# Patient Record
Sex: Female | Born: 1950 | Race: Black or African American | Hispanic: No | Marital: Single | Smoking: Never smoker
Health system: Southern US, Community
[De-identification: ages and names within clinical notes are randomized; demographics above are authoritative.]

## PROBLEM LIST (undated history)

## (undated) HISTORY — PX: HIP SURGERY: SHX245

## (undated) HISTORY — PX: ABDOMINAL HYSTERECTOMY: SHX81

---

## 2010-09-15 ENCOUNTER — Ambulatory Visit (HOSPITAL_COMMUNITY)
Admission: RE | Admit: 2010-09-15 | Discharge: 2010-09-15 | Disposition: A | Discharge status: Home / Self Care | Payer: Self-pay | Source: Ambulatory Visit | Attending: Family Medicine | Admitting: Family Medicine

## 2010-09-15 ENCOUNTER — Other Ambulatory Visit (HOSPITAL_COMMUNITY): Payer: Self-pay | Admitting: Family Medicine

## 2010-09-15 DIAGNOSIS — M25519 Pain in unspecified shoulder: Secondary | ICD-10-CM | POA: Insufficient documentation

## 2010-09-15 DIAGNOSIS — T1490XA Injury, unspecified, initial encounter: Secondary | ICD-10-CM

## 2010-09-15 DIAGNOSIS — M79609 Pain in unspecified limb: Secondary | ICD-10-CM | POA: Insufficient documentation

## 2010-10-22 ENCOUNTER — Other Ambulatory Visit (HOSPITAL_COMMUNITY): Payer: Self-pay | Admitting: Family Medicine

## 2010-10-22 ENCOUNTER — Ambulatory Visit (HOSPITAL_COMMUNITY)
Admission: RE | Admit: 2010-10-22 | Discharge: 2010-10-22 | Disposition: A | Discharge status: Home / Self Care | Payer: Self-pay | Source: Ambulatory Visit | Attending: Family Medicine | Admitting: Family Medicine

## 2010-10-22 DIAGNOSIS — R52 Pain, unspecified: Secondary | ICD-10-CM

## 2010-10-22 DIAGNOSIS — M25559 Pain in unspecified hip: Secondary | ICD-10-CM | POA: Insufficient documentation

## 2010-11-14 ENCOUNTER — Inpatient Hospital Stay (INDEPENDENT_AMBULATORY_CARE_PROVIDER_SITE_OTHER)
Admission: RE | Admit: 2010-11-14 | Discharge: 2010-11-14 | Disposition: A | Discharge status: Home / Self Care | Payer: Self-pay | Source: Ambulatory Visit | Attending: Emergency Medicine | Admitting: Emergency Medicine

## 2010-11-14 DIAGNOSIS — M76899 Other specified enthesopathies of unspecified lower limb, excluding foot: Secondary | ICD-10-CM

## 2010-12-26 ENCOUNTER — Inpatient Hospital Stay (INDEPENDENT_AMBULATORY_CARE_PROVIDER_SITE_OTHER)
Admission: RE | Admit: 2010-12-26 | Discharge: 2010-12-26 | Disposition: A | Discharge status: Home / Self Care | Payer: Self-pay | Source: Ambulatory Visit | Attending: Family Medicine | Admitting: Family Medicine

## 2010-12-26 ENCOUNTER — Ambulatory Visit (INDEPENDENT_AMBULATORY_CARE_PROVIDER_SITE_OTHER): Payer: Self-pay

## 2010-12-26 DIAGNOSIS — M1990 Unspecified osteoarthritis, unspecified site: Secondary | ICD-10-CM

## 2011-04-13 ENCOUNTER — Other Ambulatory Visit (HOSPITAL_COMMUNITY): Payer: Self-pay | Admitting: Family Medicine

## 2011-04-13 DIAGNOSIS — M25551 Pain in right hip: Secondary | ICD-10-CM

## 2011-04-13 DIAGNOSIS — M25552 Pain in left hip: Secondary | ICD-10-CM

## 2011-04-17 ENCOUNTER — Inpatient Hospital Stay (HOSPITAL_COMMUNITY): Admission: RE | Admit: 2011-04-17 | Payer: Self-pay | Source: Ambulatory Visit

## 2011-04-18 ENCOUNTER — Ambulatory Visit (HOSPITAL_COMMUNITY)
Admission: RE | Admit: 2011-04-18 | Discharge: 2011-04-18 | Disposition: A | Discharge status: Home / Self Care | Payer: Medicaid Other | Source: Ambulatory Visit | Attending: Family Medicine | Admitting: Family Medicine

## 2011-04-18 DIAGNOSIS — M1610 Unilateral primary osteoarthritis, unspecified hip: Secondary | ICD-10-CM | POA: Insufficient documentation

## 2011-04-18 DIAGNOSIS — M25551 Pain in right hip: Secondary | ICD-10-CM

## 2011-04-18 DIAGNOSIS — M169 Osteoarthritis of hip, unspecified: Secondary | ICD-10-CM | POA: Insufficient documentation

## 2011-04-18 DIAGNOSIS — M87059 Idiopathic aseptic necrosis of unspecified femur: Secondary | ICD-10-CM | POA: Insufficient documentation

## 2011-04-18 DIAGNOSIS — M6580 Other synovitis and tenosynovitis, unspecified site: Secondary | ICD-10-CM | POA: Insufficient documentation

## 2011-04-18 DIAGNOSIS — M25552 Pain in left hip: Secondary | ICD-10-CM

## 2011-12-26 ENCOUNTER — Other Ambulatory Visit (HOSPITAL_COMMUNITY): Payer: Self-pay | Admitting: Family Medicine

## 2011-12-26 ENCOUNTER — Ambulatory Visit (HOSPITAL_COMMUNITY)
Admission: RE | Admit: 2011-12-26 | Discharge: 2011-12-26 | Disposition: A | Discharge status: Home / Self Care | Payer: Medicaid Other | Source: Ambulatory Visit | Attending: Family Medicine | Admitting: Family Medicine

## 2011-12-26 DIAGNOSIS — R52 Pain, unspecified: Secondary | ICD-10-CM

## 2011-12-26 DIAGNOSIS — M79609 Pain in unspecified limb: Secondary | ICD-10-CM | POA: Insufficient documentation

## 2013-01-06 ENCOUNTER — Other Ambulatory Visit: Payer: Self-pay | Admitting: Internal Medicine

## 2013-01-06 DIAGNOSIS — N6320 Unspecified lump in the left breast, unspecified quadrant: Secondary | ICD-10-CM

## 2013-01-06 DIAGNOSIS — N644 Mastodynia: Secondary | ICD-10-CM

## 2013-01-19 ENCOUNTER — Ambulatory Visit
Admission: RE | Admit: 2013-01-19 | Discharge: 2013-01-19 | Disposition: A | Discharge status: Home / Self Care | Payer: Medicare Other | Source: Ambulatory Visit | Attending: Internal Medicine | Admitting: Internal Medicine

## 2013-01-19 ENCOUNTER — Ambulatory Visit
Admission: RE | Admit: 2013-01-19 | Discharge: 2013-01-19 | Disposition: A | Discharge status: Home / Self Care | Payer: Medicaid Other | Source: Ambulatory Visit | Attending: Internal Medicine | Admitting: Internal Medicine

## 2013-01-19 DIAGNOSIS — N6320 Unspecified lump in the left breast, unspecified quadrant: Secondary | ICD-10-CM

## 2013-01-19 DIAGNOSIS — N644 Mastodynia: Secondary | ICD-10-CM

## 2015-10-04 ENCOUNTER — Emergency Department (INDEPENDENT_AMBULATORY_CARE_PROVIDER_SITE_OTHER)
Admission: EM | Admit: 2015-10-04 | Discharge: 2015-10-04 | Disposition: A | Discharge status: Home / Self Care | Payer: Medicare Other | Source: Home / Self Care | Attending: Family Medicine | Admitting: Family Medicine

## 2015-10-04 ENCOUNTER — Encounter (HOSPITAL_COMMUNITY): Payer: Self-pay | Admitting: Emergency Medicine

## 2015-10-04 DIAGNOSIS — L259 Unspecified contact dermatitis, unspecified cause: Secondary | ICD-10-CM

## 2015-10-04 MED ORDER — MOMETASONE FUROATE 0.1 % EX CREA: 1.0000 "application " | TOPICAL_CREAM | Freq: Every day | CUTANEOUS | Status: DC

## 2015-10-04 NOTE — ED Notes (Signed)
[facility] with a complaint of a rash on both hands x 8 days. The patient stated that she has not changed any cleaners or wearing gloves.

## 2015-10-04 NOTE — ED Provider Notes (Signed)
CSN: [id number]     Arrival date & time 10/04/15  1743 History   First MD Initiated Contact with Patient 10/04/15 1954     Chief Complaint  Patient presents with  . Rash   (Consider location/radiation/quality/duration/timing/severity/associated sxs/prior Treatment) Patient is a 65 y.o. female presenting with rash. The history is provided by the patient. No language interpreter was used.  Rash Patient presents for pruritic rash on both hands, beginning last Tues, March 16th.  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 Took Benadryl without relief. Was off work for the weekend, noticed it got worse rather than better. No new hygiene products or household products, no new detergents.  This week was working in job and worsened again.   No fevers or chills, does not feel ill. No sneeze or cough, no rhinorrhea. Takes no oral medications.   Allergy to PCN, "rash".    History reviewed. No pertinent past medical history. Past Surgical History  Procedure Laterality Date  . Hip surgery     History reviewed. No pertinent family history. Social History  Substance Use Topics  . Smoking status: Never Smoker   . Smokeless tobacco: None  . Alcohol Use: No   OB History    No data available     Review of Systems  Skin: Positive for rash.    Allergies  Penicillins  Home Medications   Prior to Admission medications   Medication Sig Start Date End Date Taking? Authorizing Provider  mometasone (ELOCON) 0.1 % cream Apply 1 application topically daily. 10/04/15   Barbaraann BarthelJames O Malea Swilling, MD   Meds Ordered and Administered this Visit  Medications - No data to display  BP 150/94 mmHg  Pulse 86  Temp(Src) 97.9 F (36.6 C) (Oral)  Resp 16  SpO2 99% No data found.   Physical Exam  Constitutional: She appears well-developed and well-nourished. No distress.  HENT:  Mouth/Throat: Oropharynx is clear and moist. No oropharyngeal exudate.  Skin:  She is not diaphoretic.  Dorsum of both hands with pruritic raised rash in glove distribution from wrist to PIP joints; spares distal digits and palms bilaterally.  Palpable radial pulses bilat. Sensation grossly intact bilat.  Handgrip and strength grossly intact.   No active synovitis.     ED Course  Procedures (including critical care time)  Labs Review Labs Reviewed - No data to display  Imaging Review No results found.   Visual Acuity Review  Right Eye Distance:   Left Eye Distance:   Bilateral Distance:    Right Eye Near:   Left Eye Near:    Bilateral Near:         MDM   1. Contact dermatitis    Contact dermatitis; unclear inciting agent. Topical mometasone for up to 2 weeks.  To consider possible inciting agents in her environment. Sees Dr August Saucerean as her primary doctor. Encouraged to follow up with him if not better.   Paula ComptonJames Lidia Clavijo, MD    Barbaraann BarthelJames O Neill Jurewicz, MD 10/04/15 2011

## 2015-10-04 NOTE — Discharge Instructions (Signed)
It is a pleasure to see you today.  I believe the rash and itch on your hands is due to an allergic reaction to something that has been in contact with your skin.   I am prescribing MOMETASONE 0.1% cream, apply a small amount to the affected area once daily.  Do not use for more than 15 consecutive days, as the topical steroid can cause skin thinning and discoloration.   Follow up with Dr August Saucerean if not improving.

## 2015-12-08 ENCOUNTER — Encounter (HOSPITAL_COMMUNITY): Payer: Self-pay | Admitting: *Deleted

## 2015-12-08 ENCOUNTER — Ambulatory Visit (HOSPITAL_COMMUNITY)
Admission: EM | Admit: 2015-12-08 | Discharge: 2015-12-08 | Disposition: A | Discharge status: Home / Self Care | Payer: Medicare Other | Attending: Family Medicine | Admitting: Family Medicine

## 2015-12-08 ENCOUNTER — Ambulatory Visit (INDEPENDENT_AMBULATORY_CARE_PROVIDER_SITE_OTHER): Payer: Medicare Other

## 2015-12-08 DIAGNOSIS — W010XXA Fall on same level from slipping, tripping and stumbling without subsequent striking against object, initial encounter: Secondary | ICD-10-CM

## 2015-12-08 DIAGNOSIS — M5432 Sciatica, left side: Secondary | ICD-10-CM

## 2015-12-08 DIAGNOSIS — M47816 Spondylosis without myelopathy or radiculopathy, lumbar region: Secondary | ICD-10-CM | POA: Diagnosis not present

## 2015-12-08 DIAGNOSIS — M25552 Pain in left hip: Secondary | ICD-10-CM | POA: Diagnosis not present

## 2015-12-08 MED ORDER — PREDNISONE 20 MG PO TABS: ORAL_TABLET | ORAL | Status: DC

## 2015-12-08 MED ORDER — IBUPROFEN 800 MG PO TABS
ORAL_TABLET | ORAL | Status: AC
  Filled 2015-12-08: qty 1

## 2015-12-08 MED ORDER — HYDROCODONE-ACETAMINOPHEN 5-325 MG PO TABS: 1.0000 | ORAL_TABLET | Freq: Four times a day (QID) | ORAL | Status: DC | PRN

## 2015-12-08 MED ORDER — CYCLOBENZAPRINE HCL 10 MG PO TABS: 10.0000 mg | ORAL_TABLET | Freq: Three times a day (TID) | ORAL | Status: DC | PRN

## 2015-12-08 MED ORDER — IBUPROFEN 800 MG PO TABS
800.0000 mg | ORAL_TABLET | Freq: Once | ORAL | Status: AC
  Administered 2015-12-08: 800 mg via ORAL

## 2015-12-08 MED ORDER — TRAMADOL HCL 50 MG PO TABS: 50.0000 mg | ORAL_TABLET | Freq: Four times a day (QID) | ORAL | Status: DC | PRN

## 2015-12-08 NOTE — Discharge Instructions (Signed)
°  Sciatica °Sciatica is pain, weakness, numbness, or tingling along your sciatic nerve. The nerve starts in the lower back and runs down the back of each leg. Nerve damage or certain conditions pinch or put pressure on the sciatic nerve. This causes the pain, weakness, and other discomforts of sciatica. °HOME CARE  °· Only take medicine as told by your doctor. °· Apply ice to the affected area for 20 minutes. Do this 3-4 times a day for the first 48-72 hours. Then try heat in the same way. °· Exercise, stretch, or do your usual activities if these do not make your pain worse. °· Go to physical therapy as told by your doctor. °· Keep all doctor visits as told. °· Do not wear high heels or shoes that are not supportive. °· Get a firm mattress if your mattress is too soft to lessen pain and discomfort. °GET HELP RIGHT AWAY IF:  °· You cannot control when you poop (bowel movement) or pee (urinate). °· You have more weakness in your lower back, lower belly (pelvis), butt (buttocks), or legs. °· You have redness or puffiness (swelling) of your back. °· You have a burning feeling when you pee. °· You have pain that gets worse when you lie down. °· You have pain that wakes you from your sleep. °· Your pain is worse than past pain. °· Your pain lasts longer than 4 weeks. °· You are suddenly losing weight without reason. °MAKE SURE YOU:  °· Understand these instructions. °· Will watch this condition. °· Will get help right away if you are not doing well or get worse. °  °This information is not intended to replace advice given to you by your health care provider. Make sure you discuss any questions you have with your health care provider. °  °Document Released: 04/07/2008 Document Revised: 03/20/2015 Document Reviewed: 11/08/2011 °Elsevier Interactive Patient Education ©2016 [employer] ° ° °

## 2015-12-08 NOTE — ED Provider Notes (Signed)
CSN: [id number]     Arrival date & time 12/08/15  1257 History   None    Chief Complaint  Patient presents with  . Fall     Patient is a 65 y.o. female presenting with fall. The history is provided by the patient.  Fall This is a new problem. The current episode started 12 to 24 hours ago. The problem has not changed since onset.Pertinent negatives include no chest pain, no abdominal pain, no headaches and no shortness of breath. The symptoms are aggravated by walking, bending, twisting and standing. Nothing relieves the symptoms. The treatment provided no relief.  Pt reports she fell at [employer] yesterday at approx 1300 when she stepped in water. She has had pain to her (L) lower back since with tingling that radiates down her (L) leg to her foot. Sge tried Ibuprofen w/o relief. Concerned pain has persisted. Pt is s/p (L) hip replacement approx 5 yrs ago.   History reviewed. No pertinent past medical history. Past Surgical History  Procedure Laterality Date  . Hip surgery     History reviewed. No pertinent family history. Social History  Substance Use Topics  . Smoking status: Never Smoker   . Smokeless tobacco: None  . Alcohol Use: No   OB History    No data available     Review of Systems  Respiratory: Negative for shortness of breath.   Cardiovascular: Negative for chest pain.  Gastrointestinal: Negative for abdominal pain.  Neurological: Negative for headaches.  All other systems reviewed and are negative.   Allergies  Penicillins  Home Medications   Prior to Admission medications   Medication Sig Start Date End Date Taking? Authorizing Provider  cyclobenzaprine (FLEXERIL) 10 MG tablet Take 1 tablet (10 mg total) by mouth 3 (three) times daily as needed for muscle spasms (Back pain). 12/08/15   Roma KayserKatherine P Shylynn Bruning, NP  HYDROcodone-acetaminophen (NORCO/VICODIN) 5-325 MG tablet Take 1-2 tablets by mouth every 6 (six) hours as needed for moderate pain or severe pain.  12/08/15   Roma KayserKatherine P Jil Penland, NP  mometasone (ELOCON) 0.1 % cream Apply 1 application topically daily. 10/04/15   Barbaraann BarthelJames O Breen, MD  predniSONE (DELTASONE) 20 MG tablet Take 60 mg on day 1, then 40 mg on days 2-5. 12/08/15   Roma KayserKatherine P Lezlee Gills, NP  traMADol (ULTRAM) 50 MG tablet Take 1 tablet (50 mg total) by mouth every 6 (six) hours as needed. 12/08/15   Leanne ChangKatherine P Anaika Santillano, NP   Meds Ordered and Administered this Visit   Medications  ibuprofen (ADVIL,MOTRIN) tablet 800 mg (800 mg Oral Given 12/08/15 1440)    BP 123/83 mmHg  Pulse 82  Temp(Src) 98.1 F (36.7 C) (Oral)  Resp 16  SpO2 97% No data found.   Physical Exam  Constitutional: She appears well-developed.  HENT:  Head: Normocephalic.  Cardiovascular: Normal rate.   Pulmonary/Chest: Effort normal.  Musculoskeletal: She exhibits tenderness.       Lumbar back: She exhibits decreased range of motion, tenderness and pain. She exhibits no bony tenderness, no swelling and no deformity.       Back:    ED Course  Procedures (including critical care time)  Labs Review Labs Reviewed - No data to display  Imaging Review Dg Lumbar Spine Complete  12/08/2015  CLINICAL DATA:  [facility] and [facility], pt has hip and back pain EXAM: LUMBAR SPINE - COMPLETE 4+ VIEW COMPARISON:  None  FINDINGS: Five lumbar type vertebral bodies. Minimal convex right lumbar spine curvature. Sacroiliac joints are symmetric. 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 IMPRESSION: No acute osseous abnormality. 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 Electronically Signed   By: Jeronimo Greaves M.D.   On: 12/08/2015 14:35   Dg Hip Unilat With Pelvis 2-3 Views Left  12/08/2015  CLINICAL DATA:  Slip and fall with left hip pain, initial encounter EXAM: DG HIP (WITH OR WITHOUT PELVIS) 2-3V LEFT COMPARISON:  None. FINDINGS: Bilateral hip replacements are  seen. No acute fracture or dislocation is noted. No gross soft tissue abnormality is seen. IMPRESSION: No acute abnormality noted. Electronically Signed   By: Alcide Clever M.D.   On: 12/08/2015 14:38     Visual Acuity Review  Right Eye Distance:   Left Eye Distance:   Bilateral Distance:    Right Eye Near:   Left Eye Near:    Bilateral Near:         MDM  1.Fall 2. Sciatica neuralgia (L) (L) hip and lumbar spine imaging w/o acute findings. Will treat for sciatica, Prednisone, Norco, Flexeril and Tramadol (PRN id needed at work). Work note for [employer] and recommended f/u w/ ortho MD in [facility] if symptoms worsen or do not improve.     Leanne Chang, NP 12/08/15 [id number]

## 2015-12-08 NOTE — ED Notes (Signed)
Pt   Reports       She [place]  [place]  [place]  [employer]    C.o  Pain l  Hip      -  Pain is  Worse  On  Weight  Bearing         pt has  A  History  Of   Hip surgery  In  Past      -    Pt  Ambulated  To  Room         She  denys  Any  Other injurys

## 2015-12-12 DIAGNOSIS — Z880 Allergy status to penicillin: Secondary | ICD-10-CM | POA: Diagnosis not present

## 2015-12-12 DIAGNOSIS — S7002XA Contusion of left hip, initial encounter: Secondary | ICD-10-CM | POA: Diagnosis not present

## 2015-12-12 DIAGNOSIS — Z471 Aftercare following joint replacement surgery: Secondary | ICD-10-CM | POA: Diagnosis not present

## 2015-12-12 DIAGNOSIS — Z96643 Presence of artificial hip joint, bilateral: Secondary | ICD-10-CM | POA: Diagnosis not present

## 2016-03-13 ENCOUNTER — Other Ambulatory Visit: Payer: Self-pay | Admitting: Internal Medicine

## 2016-03-13 DIAGNOSIS — Z1231 Encounter for screening mammogram for malignant neoplasm of breast: Secondary | ICD-10-CM

## 2016-03-18 ENCOUNTER — Ambulatory Visit
Admission: RE | Admit: 2016-03-18 | Discharge: 2016-03-18 | Disposition: A | Discharge status: Home / Self Care | Payer: Medicare Other | Source: Ambulatory Visit | Attending: Internal Medicine | Admitting: Internal Medicine

## 2016-03-18 DIAGNOSIS — Z1231 Encounter for screening mammogram for malignant neoplasm of breast: Secondary | ICD-10-CM | POA: Diagnosis not present

## 2016-09-19 ENCOUNTER — Encounter (HOSPITAL_COMMUNITY): Payer: Self-pay | Admitting: Emergency Medicine

## 2016-09-19 ENCOUNTER — Ambulatory Visit (HOSPITAL_COMMUNITY)
Admission: EM | Admit: 2016-09-19 | Discharge: 2016-09-19 | Disposition: A | Discharge status: Home / Self Care | Payer: Medicare Other | Attending: Internal Medicine | Admitting: Internal Medicine

## 2016-09-19 DIAGNOSIS — R05 Cough: Secondary | ICD-10-CM

## 2016-09-19 DIAGNOSIS — R059 Cough, unspecified: Secondary | ICD-10-CM

## 2016-09-19 DIAGNOSIS — J40 Bronchitis, not specified as acute or chronic: Secondary | ICD-10-CM

## 2016-09-19 MED ORDER — IPRATROPIUM BROMIDE 0.06 % NA SOLN: 2.0000 | Freq: Four times a day (QID) | NASAL | 0 refills | Status: DC

## 2016-09-19 MED ORDER — BENZONATATE 100 MG PO CAPS: 200.0000 mg | ORAL_CAPSULE | Freq: Three times a day (TID) | ORAL | 0 refills | Status: DC | PRN

## 2016-09-19 MED ORDER — AZITHROMYCIN 250 MG PO TABS: 250.0000 mg | ORAL_TABLET | Freq: Every day | ORAL | 0 refills | Status: DC

## 2016-09-19 NOTE — ED Provider Notes (Signed)
CSN: [id number]     Arrival date & time 09/19/16  1206 History   None    Chief Complaint  Patient presents with  . sinus congestion   (Consider location/radiation/quality/duration/timing/severity/associated sxs/prior Treatment) Patient c/o URI sx's for a week.  He has been having congestion and cough for a week.   The history is provided by the patient.  URI  Presenting symptoms: congestion, cough and fatigue   Severity:  Moderate Onset quality:  Sudden Duration:  1 week Timing:  Constant Progression:  Worsening Chronicity:  New Relieved by:  Nothing   History reviewed. No pertinent past medical history. Past Surgical History:  Procedure Laterality Date  . HIP SURGERY     History reviewed. No pertinent family history. Social History  Substance Use Topics  . Smoking status: Never Smoker  . Smokeless tobacco: Never Used  . Alcohol use No   OB History    No data available     Review of Systems  Constitutional: Positive for fatigue.  HENT: Positive for congestion.   Eyes: Negative.   Respiratory: Positive for cough.   Cardiovascular: Negative.   Gastrointestinal: Negative.   Endocrine: Negative.   Genitourinary: Negative.   Musculoskeletal: Negative.   Allergic/Immunologic: Negative.   Neurological: Negative.   Hematological: Negative.   Psychiatric/Behavioral: Negative.     Allergies  Penicillins  Home Medications   Prior to Admission medications   Medication Sig Start Date End Date Taking? Authorizing Provider  azithromycin (ZITHROMAX) 250 MG tablet Take 1 tablet (250 mg total) by mouth daily. Take first 2 tablets together, then 1 every day until finished. 09/19/16   Deatra Canter, FNP  benzonatate (TESSALON) 100 MG capsule Take 2 capsules (200 mg total) by mouth 3 (three) times daily as needed for cough. 09/19/16   Deatra Canter, FNP  cyclobenzaprine (FLEXERIL) 10 MG tablet Take 1 tablet (10 mg total) by mouth 3 (three) times daily as needed for  muscle spasms (Back pain). 12/08/15   Roma Kayser Schorr, NP  HYDROcodone-acetaminophen (NORCO/VICODIN) 5-325 MG tablet Take 1-2 tablets by mouth every 6 (six) hours as needed for moderate pain or severe pain. 12/08/15   Roma Kayser Schorr, NP  mometasone (ELOCON) 0.1 % cream Apply 1 application topically daily. 10/04/15   Barbaraann Barthel, MD  predniSONE (DELTASONE) 20 MG tablet Take 60 mg on day 1, then 40 mg on days 2-5. 12/08/15   Roma Kayser Schorr, NP  traMADol (ULTRAM) 50 MG tablet Take 1 tablet (50 mg total) by mouth every 6 (six) hours as needed. 12/08/15   Leanne Chang, NP   Meds Ordered and Administered this Visit  Medications - No data to display  BP 123/81 (BP Location: Left Arm)   Pulse 91   Temp 98.4 F (36.9 C) (Oral)   Resp 12   SpO2 99%  No data found.   Physical Exam  Constitutional: She is oriented to person, place, and time. She appears well-developed and well-nourished.  HENT:  Head: Normocephalic and atraumatic.  Right Ear: External ear normal.  Left Ear: External ear normal.  Mouth/Throat: Oropharynx is clear and moist.  Eyes: Conjunctivae and EOM are normal. Pupils are equal, round, and reactive to light.  Neck: Normal range of motion. Neck supple.  Cardiovascular: Normal rate, regular rhythm and normal heart sounds.   Pulmonary/Chest: Effort normal and breath sounds normal.  Abdominal: Soft. Bowel sounds are normal.  Musculoskeletal: Normal range of motion.  Neurological: She is alert and oriented to  person, place, and time.  Nursing note and vitals reviewed.   Urgent Care Course     Procedures (including critical care time)  Labs Review Labs Reviewed - No data to display  Imaging Review No results found.   Visual Acuity Review  Right Eye Distance:   Left Eye Distance:   Bilateral Distance:    Right Eye Near:   Left Eye Near:    Bilateral Near:         MDM   1. Bronchitis   2. Cough    Zpak Tessalon Perles 200mg  one po tid prn  #21 Atrovent nasal spray  Push po fluids, rest, tylenol and motrin otc prn as directed for fever, arthralgias, and myalgias.  Follow up prn if sx's continue or persist.    Deatra CanterWilliam J Haydn Cush, FNP 09/19/16 1305    Deatra CanterWilliam J Dannielle Baskins, FNP 09/19/16 1322

## 2016-09-19 NOTE — ED Triage Notes (Signed)
Pt has been suffering from sinus pressure and congestion for one week.  She denies any fever. 

## 2016-12-09 DIAGNOSIS — Z880 Allergy status to penicillin: Secondary | ICD-10-CM | POA: Diagnosis not present

## 2016-12-09 DIAGNOSIS — Z471 Aftercare following joint replacement surgery: Secondary | ICD-10-CM | POA: Diagnosis not present

## 2016-12-09 DIAGNOSIS — Z96643 Presence of artificial hip joint, bilateral: Secondary | ICD-10-CM | POA: Diagnosis not present

## 2016-12-09 DIAGNOSIS — M7062 Trochanteric bursitis, left hip: Secondary | ICD-10-CM | POA: Diagnosis not present

## 2017-04-11 ENCOUNTER — Ambulatory Visit (HOSPITAL_COMMUNITY)
Admission: EM | Admit: 2017-04-11 | Discharge: 2017-04-11 | Disposition: A | Discharge status: Home / Self Care | Payer: Medicare Other

## 2017-04-11 ENCOUNTER — Encounter (HOSPITAL_COMMUNITY): Payer: Self-pay | Admitting: Emergency Medicine

## 2017-04-11 DIAGNOSIS — J069 Acute upper respiratory infection, unspecified: Secondary | ICD-10-CM | POA: Diagnosis not present

## 2017-04-11 MED ORDER — IPRATROPIUM BROMIDE 0.06 % NA SOLN: 2.0000 | Freq: Four times a day (QID) | NASAL | 0 refills | Status: DC

## 2017-04-11 NOTE — Discharge Instructions (Signed)
Sometimes allergies and head colds has similar symptoms. The treatments are quite similar. Use the Atrovent nasal spray for runny nose and the following medications can help with other symptoms. Patient to drink plenty of fluids stay well-hydrated. Gargle with warm salt water. If you develop fever or worsening follow-up with your primary care provider or may return. Sudafed PE 10 mg every 4 to 6 hours as needed for congestion Allegra or Zyrtec daily as needed for drainage and runny nose. For stronger antihistamine may take Chlor-Trimeton 2 to 4 mg every 4 to 6 hours, may cause drowsiness.S Saline nasal spray used frequently. Drink plenty of fluids and stay well-hydrated. Flonase or Rhinocort nasal spray daily

## 2017-04-11 NOTE — ED Provider Notes (Signed)
[facility]    CSN: [id number] Arrival date & time: 04/11/17  1205     History   Chief Complaint Chief Complaint  Patient presents with  . URI    HPI Patricia Armstrong is a 66 y.o. female.   66 year old female complaining of nasal congestion, stuffy nose, runny nose, chills. Denies sore throat or earache. Denies fever. She has been taking NyQuil and Aleve.      History reviewed. No pertinent past medical history.  There are no active problems to display for this patient.   Past Surgical History:  Procedure Laterality Date  . HIP SURGERY      OB History    No data available       Home Medications    Prior to Admission medications   Medication Sig Start Date End Date Taking? Authorizing Provider  ibuprofen (ADVIL,MOTRIN) 200 MG tablet Take 200 mg by mouth every 6 (six) hours as needed.   Yes [provider]  Pseudoeph-Doxylamine-DM-APAP (NYQUIL PO) Take by mouth.   Yes [provider]  cyclobenzaprine (FLEXERIL) 10 MG tablet Take 1 tablet (10 mg total) by mouth 3 (three) times daily as needed for muscle spasms (Back pain). 12/08/15   Schorr, Roma Kayser, NP  HYDROcodone-acetaminophen (NORCO/VICODIN) 5-325 MG tablet Take 1-2 tablets by mouth every 6 (six) hours as needed for moderate pain or severe pain. 12/08/15   Schorr, Roma Kayser, NP  ipratropium (ATROVENT) 0.06 % nasal spray Place 2 sprays into both nostrils 4 (four) times daily. 04/11/17   Hayden Rasmussen, NP  mometasone (ELOCON) 0.1 % cream Apply 1 application topically daily. 10/04/15   Barbaraann Barthel, MD  traMADol (ULTRAM) 50 MG tablet Take 1 tablet (50 mg total) by mouth every 6 (six) hours as needed. 12/08/15   Schorr, Roma Kayser, NP    Family History No family history on file.  Social History Social History  Substance Use Topics  . Smoking status: Never Smoker  . Smokeless tobacco: Never Used  . Alcohol use No     Allergies   Penicillins   Review of Systems Review  of Systems  Constitutional: Positive for activity change and chills. Negative for appetite change, fatigue and fever.  HENT: Positive for congestion, postnasal drip and rhinorrhea. Negative for facial swelling and sore throat.   Eyes: Negative.   Respiratory: Positive for cough. Negative for shortness of breath.   Cardiovascular: Negative.   Musculoskeletal: Negative for neck pain and neck stiffness.  Skin: Negative for pallor and rash.  Neurological: Negative.   All other systems reviewed and are negative.    Physical Exam Triage Vital Signs ED Triage Vitals  Enc Vitals Group     BP 04/11/17 1302 122/84     Pulse Rate 04/11/17 1302 68     Resp 04/11/17 1302 18     Temp 04/11/17 1302 98.4 F (36.9 C)     Temp Source 04/11/17 1302 Oral     SpO2 04/11/17 1302 100 %     Weight --      Height --      Head Circumference --      Peak Flow --      Pain Score 04/11/17 1300 3     Pain Loc --      Pain Edu? --      Excl. in GC? --    No data found.   Updated Vital Signs BP 122/84 (BP Location: Left Arm)   Pulse 68   Temp  98.4 F (36.9 C) (Oral)   Resp 18   SpO2 100%   Visual Acuity Right Eye Distance:   Left Eye Distance:   Bilateral Distance:    Right Eye Near:   Left Eye Near:    Bilateral Near:     Physical Exam  Constitutional: She is oriented to person, place, and time. She appears well-developed and well-nourished. No distress.  HENT:  Bilateral TMs are normal. Oropharynx with thick PND and minimal erythema. No swelling or exudate.  Eyes: EOM are normal.  Neck: Normal range of motion. Neck supple.  Cardiovascular: Normal rate, regular rhythm and normal heart sounds.   Pulmonary/Chest: Effort normal and breath sounds normal. No respiratory distress. She has no wheezes. She has no rales.  Musculoskeletal: Normal range of motion. She exhibits no edema.  Lymphadenopathy:    She has no cervical adenopathy.  Neurological: She is alert and oriented to person,  place, and time.  Skin: Skin is warm and dry. No rash noted.  Psychiatric: She has a normal mood and affect.  Nursing note and vitals reviewed.    UC Treatments / Results  Labs (all labs ordered are listed, but only abnormal results are displayed) Labs Reviewed - No data to display  EKG  EKG Interpretation None       Radiology No results found.  Procedures Procedures (including critical care time)  Medications Ordered in UC Medications - No data to display   Initial Impression / Assessment and Plan / UC Course  I have reviewed the triage vital signs and the nursing notes.  Pertinent labs & imaging results that were available during my care of the patient were reviewed by me and considered in my medical decision making (see chart for details).    Sometimes allergies and head colds has similar symptoms. The treatments are quite similar. Use the Atrovent nasal spray for runny nose and the following medications can help with other symptoms. Patient to drink plenty of fluids stay well-hydrated. Gargle with warm salt water. If you develop fever or worsening follow-up with your primary care provider or may return. Sudafed PE 10 mg every 4 to 6 hours as needed for congestion Allegra or Zyrtec daily as needed for drainage and runny nose. For stronger antihistamine may take Chlor-Trimeton 2 to 4 mg every 4 to 6 hours, may cause drowsiness.S Saline nasal spray used frequently. Drink plenty of fluids and stay well-hydrated. Flonase or Rhinocort nasal spray daily Patient instructed not to take these medications with NyQuil.  Final Clinical Impressions(s) / UC Diagnoses   Final diagnoses:  Viral upper respiratory tract infection    New Prescriptions New Prescriptions   IPRATROPIUM (ATROVENT) 0.06 % NASAL SPRAY    Place 2 sprays into both nostrils 4 (four) times daily.     Controlled Substance Prescriptions [place] Controlled Substance Registry consulted? Not Applicable   Hayden Rasmussen, NP 04/11/17 1335

## 2017-04-11 NOTE — ED Triage Notes (Signed)
Head congestion for a week.  Patient is having runny nose, hot and cold chills and aching.

## 2017-09-04 ENCOUNTER — Ambulatory Visit (HOSPITAL_COMMUNITY)
Admission: EM | Admit: 2017-09-04 | Discharge: 2017-09-04 | Disposition: A | Discharge status: Home / Self Care | Payer: Medicare Other | Attending: Emergency Medicine | Admitting: Emergency Medicine

## 2017-09-04 ENCOUNTER — Other Ambulatory Visit: Payer: Self-pay

## 2017-09-04 ENCOUNTER — Encounter (HOSPITAL_COMMUNITY): Payer: Self-pay | Admitting: Emergency Medicine

## 2017-09-04 DIAGNOSIS — R220 Localized swelling, mass and lump, head: Secondary | ICD-10-CM

## 2017-09-04 MED ORDER — DIPHENHYDRAMINE HCL 25 MG PO TABS: 25.0000 mg | ORAL_TABLET | Freq: Four times a day (QID) | ORAL | 0 refills | Status: DC

## 2017-09-04 MED ORDER — FAMOTIDINE 20 MG PO TABS: 20.0000 mg | ORAL_TABLET | Freq: Two times a day (BID) | ORAL | 0 refills | Status: DC

## 2017-09-04 MED ORDER — PREDNISONE 50 MG PO TABS: 50.0000 mg | ORAL_TABLET | Freq: Every day | ORAL | 0 refills | Status: AC

## 2017-09-04 NOTE — ED Provider Notes (Signed)
[facility]    CSN: [id number] Arrival date & time: 09/04/17  1200     History   Chief Complaint Chief Complaint  Patient presents with  . Lip Laceration    HPI Patricia Armstrong is a 67 y.o. female.   Zell presents with complaints of right sided lip swelling which she woke up with two days ago. She states she at a candy before going to bed prior to starting, but has not had any reaction to candy in the past. No other known allergen exposures or bites/trauma etc. She has since taken benadryl approximately 4x since then which has not helped. It is itching. Denies any pain. Denies any throat pain, tongue or mouth swelling, shortness of breath, wheezing or rash. Denies any previous similar. Takes tramadol intermittently but otherwise does not take any medications daily. She feels she has a hard time eating due to the swelling.     ROS per HPI.       History reviewed. No pertinent past medical history.  There are no active problems to display for this patient.   Past Surgical History:  Procedure Laterality Date  . HIP SURGERY      OB History    No data available       Home Medications    Prior to Admission medications   Medication Sig Start Date End Date Taking? Authorizing Provider  diphenhydrAMINE (BENADRYL) 25 MG tablet Take 1 tablet (25 mg total) by mouth every 6 (six) hours. 09/04/17   Georgetta Haber, NP  famotidine (PEPCID) 20 MG tablet Take 1 tablet (20 mg total) by mouth 2 (two) times daily. 09/04/17   Georgetta Haber, NP  predniSONE (DELTASONE) 50 MG tablet Take 1 tablet (50 mg total) by mouth daily with breakfast for 5 days. 09/04/17 09/09/17  Georgetta Haber, NP  traMADol (ULTRAM) 50 MG tablet Take 1 tablet (50 mg total) by mouth every 6 (six) hours as needed. 12/08/15   Schorr, Roma Kayser, NP    Family History No family history on file.  Social History Social History   Tobacco Use  . Smoking status: Never Smoker  . Smokeless  tobacco: Never Used  Substance Use Topics  . Alcohol use: No  . Drug use: Not on file     Allergies   Penicillins   Review of Systems Review of Systems   Physical Exam Triage Vital Signs ED Triage Vitals  Enc Vitals Group     BP 09/04/17 1243 127/85     Pulse Rate 09/04/17 1243 70     Resp 09/04/17 1243 16     Temp 09/04/17 1243 98.2 F (36.8 C)     Temp Source 09/04/17 1243 Oral     SpO2 09/04/17 1243 98 %     Weight --      Height --      Head Circumference --      Peak Flow --      Pain Score 09/04/17 1326 0     Pain Loc --      Pain Edu? --      Excl. in GC? --    No data found.  Updated Vital Signs BP 127/85 (BP Location: Left Arm)   Pulse 70   Temp 98.2 F (36.8 C) (Oral)   Resp 16   SpO2 98%   Visual Acuity Right Eye Distance:   Left Eye Distance:   Bilateral Distance:    Right Eye Near:   Left Eye  Near:    Bilateral Near:     Physical Exam  Constitutional: She is oriented to person, place, and time. She appears well-developed and well-nourished. No distress.  HENT:  Head: Normocephalic and atraumatic.  Mouth/Throat: Uvula is midline, oropharynx is clear and moist and mucous membranes are normal.    Swelling to right lips noted, very fine vesicular rash noted at vermilion border to right lower lip at area of swelling; without palpable abscess, non tender, diffuse edges; see photo   Cardiovascular: Normal rate, regular rhythm and normal heart sounds.  Pulmonary/Chest: Effort normal and breath sounds normal. She has no wheezes.  Neurological: She is alert and oriented to person, place, and time.  Skin: Skin is warm and dry.       UC Treatments / Results  Labs (all labs ordered are listed, but only abnormal results are displayed) Labs Reviewed - No data to display  EKG  EKG Interpretation None       Radiology No results found.  Procedures Procedures (including critical care time)  Medications Ordered in UC Medications - No  data to display   Initial Impression / Assessment and Plan / UC Course  I have reviewed the triage vital signs and the nursing notes.  Pertinent labs & imaging results that were available during my care of the patient were reviewed by me and considered in my medical decision making (see chart for details).     Herpes vs cellulitis vs angioedema discussed due to fine rash; nontender and without pain however. Will treat with prednisone pepcid and benadryl. Without difficulty swallowing or shortness of breath. Non toxic and without red flag findings.  Return precautions provided. Patient verbalized understanding and agreeable to plan.    Final Clinical Impressions(s) / UC Diagnoses   Final diagnoses:  Lip swelling    ED Discharge Orders        Ordered    predniSONE (DELTASONE) 50 MG tablet  Daily with breakfast     09/04/17 1348    famotidine (PEPCID) 20 MG tablet  2 times daily     09/04/17 1348    diphenhydrAMINE (BENADRYL) 25 MG tablet  Every 6 hours     09/04/17 1348       Controlled Substance Prescriptions [place] Controlled Substance Registry consulted? Not Applicable   Georgetta HaberBurky, Natalie B, NP 09/04/17 1358

## 2017-09-04 NOTE — Discharge Instructions (Signed)
5 days of prednisone. Continue with regular use of benadryl.  Twice a day pepcid. If develop swelling, itching to throat, difficulty swallowing, wheezing or shortness of breath return immediately or go to Er. If symptoms do not improve in the next week to return to be seen or to follow up with your PCP.

## 2017-09-04 NOTE — ED Triage Notes (Signed)
Per pt the left side of her lips is swollen, per pt she do not know what may have happened

## 2017-09-14 ENCOUNTER — Other Ambulatory Visit: Payer: Self-pay

## 2017-09-14 ENCOUNTER — Ambulatory Visit (HOSPITAL_COMMUNITY)
Admission: EM | Admit: 2017-09-14 | Discharge: 2017-09-14 | Disposition: A | Discharge status: Home / Self Care | Payer: Medicare Other | Attending: Family Medicine | Admitting: Family Medicine

## 2017-09-14 DIAGNOSIS — R220 Localized swelling, mass and lump, head: Secondary | ICD-10-CM | POA: Diagnosis not present

## 2017-09-14 DIAGNOSIS — R21 Rash and other nonspecific skin eruption: Secondary | ICD-10-CM | POA: Diagnosis not present

## 2017-09-14 MED ORDER — DOXYCYCLINE HYCLATE 100 MG PO CAPS: 100.0000 mg | ORAL_CAPSULE | Freq: Two times a day (BID) | ORAL | 0 refills | Status: AC

## 2017-09-14 MED ORDER — CLOTRIMAZOLE 1 % EX CREA: 1.0000 "application " | TOPICAL_CREAM | Freq: Two times a day (BID) | CUTANEOUS | 0 refills | Status: AC

## 2017-09-14 NOTE — ED Triage Notes (Signed)
Pt presents with complaints of swelling and redness around her mouth x 2 weeks. States she was here and seen for the same then and given medication with no relief.

## 2017-09-14 NOTE — ED Provider Notes (Signed)
[facility]    CSN: [id number] Arrival date & time: 09/14/17  1004     History   Chief Complaint Chief Complaint  Patient presents with  . Rash    HPI Patricia Armstrong is a 67 y.o. female noncontributing past medical history presenting today with lip swelling/rash.  Rashes been going on for approximately 3 weeks, she has had redness surrounding her lips and lip swelling.  She denies difficulty breathing difficulty swallowing or difficulty eating or drinking.  She denies pain inside of the mouth or any lesions inside the mouth.  Is associated mainly with itching more so than pain.  She was seen here 2 weeks ago put on a trial of prednisone and Pepcid and Benadryl.  She did not have any improvement or relief with this.  Itching has persisted with the Pepcid and Benadryl.  Denies any new medicines, any new foods although initially she believed this to be related to a piece of candy.  No history of herpes.  HPI  No past medical history on file.  There are no active problems to display for this patient.   Past Surgical History:  Procedure Laterality Date  . HIP SURGERY      OB History    No data available       Home Medications    Prior to Admission medications   Medication Sig Start Date End Date Taking? Authorizing Provider  diphenhydrAMINE (BENADRYL) 25 MG tablet Take 1 tablet (25 mg total) by mouth every 6 (six) hours. 09/04/17  Yes Linus MakoBurky, Natalie B, NP  famotidine (PEPCID) 20 MG tablet Take 1 tablet (20 mg total) by mouth 2 (two) times daily. 09/04/17  Yes Burky, Dorene GrebeNatalie B, NP  clotrimazole (LOTRIMIN) 1 % cream Apply 1 application topically 2 (two) times daily for 14 days. 09/14/17 09/28/17  Mikaela Hilgeman C, PA-C  doxycycline (VIBRAMYCIN) 100 MG capsule Take 1 capsule (100 mg total) by mouth 2 (two) times daily for 10 days. 09/14/17 09/24/17  Mava Suares C, PA-C  traMADol (ULTRAM) 50 MG tablet Take 1 tablet (50 mg total) by mouth every 6 (six) hours as needed.  12/08/15   Schorr, Roma KayserKatherine P, NP    Family History No family history on file.  Social History Social History   Tobacco Use  . Smoking status: Never Smoker  . Smokeless tobacco: Never Used  Substance Use Topics  . Alcohol use: No  . Drug use: Not on file     Allergies   Penicillins   Review of Systems Review of Systems  Constitutional: Negative for fever.  HENT: Positive for facial swelling. Negative for congestion, drooling, ear pain, sore throat and trouble swallowing.   Eyes: Negative for visual disturbance.  Respiratory: Negative for chest tightness and shortness of breath.   Cardiovascular: Negative for chest pain.  Gastrointestinal: Negative for nausea and vomiting.  Musculoskeletal: Negative for neck pain and neck stiffness.  Skin: Positive for color change and rash.  Neurological: Negative for dizziness, weakness, light-headedness and headaches.     Physical Exam Triage Vital Signs ED Triage Vitals [09/14/17 1022]  Enc Vitals Group     BP 128/87     Pulse Rate 67     Resp 18     Temp 98.2 F (36.8 C)     Temp src      SpO2 98 %     Weight 130 lb (59 kg)     Height 5\' 5"  (1.651 m)     Head Circumference  Peak Flow      Pain Score 3     Pain Loc      Pain Edu?      Excl. in GC?    No data found.  Updated Vital Signs BP 128/87   Pulse 67   Temp 98.2 F (36.8 C)   Resp 18   Ht 5\' 5"  (1.651 m)   Wt 130 lb (59 kg)   SpO2 98%   BMI 21.63 kg/m   Visual Acuity Right Eye Distance:   Left Eye Distance:   Bilateral Distance:    Right Eye Near:   Left Eye Near:    Bilateral Near:     Physical Exam  Constitutional: She appears well-developed and well-nourished. No distress.  HENT:  Head: Normocephalic and atraumatic.  No lesions on oral mucosa  Eyes: Conjunctivae are normal.  Neck: Neck supple.  Cardiovascular: Normal rate and regular rhythm.  No murmur heard. Pulmonary/Chest: Effort normal and breath sounds normal. No respiratory  distress.  Musculoskeletal: She exhibits no edema.  Neurological: She is alert.  Skin: Skin is warm and dry.  See photo below: Surrounding erythema and mild edema to right side of the upper and lower lips, mild fissuring to crease of the lips with surrounding dryness.  No lesions observed on rest of body surfaces.  Psychiatric: She has a normal mood and affect.  Nursing note and vitals reviewed.        UC Treatments / Results  Labs (all labs ordered are listed, but only abnormal results are displayed) Labs Reviewed - No data to display  EKG  EKG Interpretation None       Radiology No results found.  Procedures Procedures (including critical care time)  Medications Ordered in UC Medications - No data to display   Initial Impression / Assessment and Plan / UC Course  I have reviewed the triage vital signs and the nursing notes.  Pertinent labs & imaging results that were available during my care of the patient were reviewed by me and considered in my medical decision making (see chart for details).     Patient with persistent symptoms, possible cellulitis versus fungal infection.  Patient without difficulty eating, drinking, breathing.  Patient is stable.  Will treat with doxycycline and provide clotrimazole cream twice daily.  Advised she may continue Pepcid and Benadryl if it provides relief.  Discussed strict return precautions. Patient verbalized understanding and is agreeable with plan.   Final Clinical Impressions(s) / UC Diagnoses   Final diagnoses:  Rash and nonspecific skin eruption  Lip swelling    ED Discharge Orders        Ordered    doxycycline (VIBRAMYCIN) 100 MG capsule  2 times daily     09/14/17 1043    clotrimazole (LOTRIMIN) 1 % cream  2 times daily     09/14/17 1043       Controlled Substance Prescriptions [place] Controlled Substance Registry consulted? Not Applicable   Lew Dawes, [place] 09/14/17 1051

## 2017-09-14 NOTE — Discharge Instructions (Signed)
Please take doxycycline twice a day for 10 days.  Please also apply fungal cream to rash- especially in creases of lips where it is cracking.

## 2018-12-23 ENCOUNTER — Encounter (HOSPITAL_COMMUNITY): Payer: Self-pay | Admitting: Emergency Medicine

## 2018-12-23 ENCOUNTER — Ambulatory Visit (INDEPENDENT_AMBULATORY_CARE_PROVIDER_SITE_OTHER): Payer: Medicare Other

## 2018-12-23 ENCOUNTER — Ambulatory Visit (HOSPITAL_COMMUNITY)
Admission: EM | Admit: 2018-12-23 | Discharge: 2018-12-23 | Disposition: A | Discharge status: Home / Self Care | Payer: Medicare Other | Attending: Family Medicine | Admitting: Family Medicine

## 2018-12-23 DIAGNOSIS — M25562 Pain in left knee: Secondary | ICD-10-CM | POA: Diagnosis not present

## 2018-12-23 DIAGNOSIS — M5432 Sciatica, left side: Secondary | ICD-10-CM

## 2018-12-23 DIAGNOSIS — M5137 Other intervertebral disc degeneration, lumbosacral region: Secondary | ICD-10-CM | POA: Diagnosis not present

## 2018-12-23 MED ORDER — METHYLPREDNISOLONE 4 MG PO TBPK: ORAL_TABLET | ORAL | 0 refills | Status: DC

## 2018-12-23 MED ORDER — TRAMADOL HCL 50 MG PO TABS: 50.0000 mg | ORAL_TABLET | Freq: Four times a day (QID) | ORAL | 0 refills | Status: DC | PRN

## 2018-12-23 NOTE — ED Provider Notes (Signed)
[facility]    CSN: [id number] Arrival date & time: 12/23/18  [id number]      History   Chief Complaint Chief Complaint  Patient presents with  . Hip Pain    HPI Patricia Armstrong is a 68 y.o. female.   HPI  Patient is here for left leg pain.  She states it goes from her hip down the thigh all the way to her foot.  No fall or injury.  No overuse.  She no prior history of back problems or degenerative disc disease, that she is aware of.  She has had both hips replaced.  It does not feel like it is in her hip joint.  It is worse with activity.  Better with rest.  She is not taking any medicine for the pain.  Gradually worsening for about a week and a half.  No bowel or bladder complaint.  She is in good health and on no medications otherwise  History reviewed. No pertinent past medical history.  There are no active problems to display for this patient.   Past Surgical History:  Procedure Laterality Date  . HIP SURGERY      OB History   No obstetric history on file.      Home Medications    Prior to Admission medications   Medication Sig Start Date End Date Taking? Authorizing Provider  methylPREDNISolone (MEDROL DOSEPAK) 4 MG TBPK tablet tad 12/23/18   Eustace MooreNelson, Janey Petron Sue, MD  traMADol (ULTRAM) 50 MG tablet Take 1 tablet (50 mg total) by mouth every 6 (six) hours as needed. 12/23/18   Eustace MooreNelson, Delonte Musich Sue, MD  diphenhydrAMINE (BENADRYL) 25 MG tablet Take 1 tablet (25 mg total) by mouth every 6 (six) hours. 09/04/17 12/23/18  Georgetta HaberBurky, Natalie B, NP  famotidine (PEPCID) 20 MG tablet Take 1 tablet (20 mg total) by mouth 2 (two) times daily. 09/04/17 12/23/18  Georgetta HaberBurky, Natalie B, NP    Family History No family history on file.  Social History Social History   Tobacco Use  . Smoking status: Never Smoker  . Smokeless tobacco: Never Used  Substance Use Topics  . Alcohol use: No  . Drug use: Not on file     Allergies   Penicillins   Review of Systems Review of  Systems  Constitutional: Negative for chills and fever.  HENT: Negative for ear pain and sore throat.   Eyes: Negative for pain and visual disturbance.  Respiratory: Negative for cough and shortness of breath.   Cardiovascular: Negative for chest pain and palpitations.  Gastrointestinal: Negative for abdominal pain and vomiting.  Genitourinary: Negative for dysuria and hematuria.  Musculoskeletal: Positive for arthralgias and back pain.  Skin: Negative for color change and rash.  Neurological: Negative for seizures and syncope.  All other systems reviewed and are negative.    Physical Exam Triage Vital Signs ED Triage Vitals  Enc Vitals Group     BP 12/23/18 0957 123/86     Pulse Rate 12/23/18 0957 91     Resp 12/23/18 0957 14     Temp 12/23/18 0957 98 F (36.7 C)     Temp Source 12/23/18 0957 Oral     SpO2 12/23/18 0957 98 %     Weight --      Height --      Head Circumference --      Peak Flow --      Pain Score 12/23/18 1011 9     Pain Loc --  Pain Edu? --      Excl. in [place]? --    No data found.  Updated Vital Signs BP 123/86 (BP Location: Left Arm)   Pulse 91   Temp 98 F (36.7 C) (Oral)   Resp 14   SpO2 98%      Physical Exam Constitutional:      General: She is not in acute distress.    Appearance: Normal appearance. She is well-developed and normal weight.     Comments: Thin.  Antalgic gait  HENT:     Head: Normocephalic and atraumatic.     Nose: Nose normal.     Mouth/Throat:     Mouth: Mucous membranes are moist.  Eyes:     Conjunctiva/sclera: Conjunctivae normal.     Pupils: Pupils are equal, round, and reactive to light.  Neck:     Musculoskeletal: Normal range of motion.  Cardiovascular:     Rate and Rhythm: Normal rate and regular rhythm.     Heart sounds: Normal heart sounds.  Pulmonary:     Effort: Pulmonary effort is normal. No respiratory distress.     Breath sounds: Normal breath sounds.  Abdominal:     General: There is no  distension.     Palpations: Abdomen is soft.  Musculoskeletal: Normal range of motion.     Comments: Mild tenderness in the left low back and left SI region.  No bony tenderness.  No increased muscle tone.  Range of motion of back is good.  Range of motion of hips is symmetric.  Strength and sensation are intact.  Patient has no left knee reflex.  Skin:    General: Skin is warm and dry.  Neurological:     Mental Status: She is alert.     Deep Tendon Reflexes: Reflexes abnormal.  Psychiatric:        Mood and Affect: Mood normal.        Behavior: Behavior normal.      UC Treatments / Results  Labs (all labs ordered are listed, but only abnormal results are displayed) Labs Reviewed - No data to display  EKG None  Radiology Dg Lumbar Spine Complete  Result Date: 12/23/2018 CLINICAL DATA:  68 year old female with lumbar spine pain and left lower extremity radiculopathy. EXAM: LUMBAR SPINE - COMPLETE 4+ VIEW COMPARISON:  Prior lumbar spine radiographs 12/08/2015 FINDINGS: No evidence of acute fracture or malalignment. The vertebral body heights are maintained. Focal degenerative disc disease is present at L5-S1, and to a lesser extent at L4-L5. There has been mild interval progression of degenerative change compared to 12/08/2015 with increased disc space loss and endplate sclerosis. Facet arthropathy is also present at L5-S1. No lytic or blastic osseous lesion. Bilateral hip arthroplasty prostheses. IMPRESSION: 1. L5-S1 degenerative disc disease and bilateral facet arthropathy slightly progressed compared to 12/08/2015. Electronically Signed   By: Jacqulynn Cadet M.D.   On: 12/23/2018 11:00    Procedures Procedures (including critical care time)  Medications Ordered in UC Medications - No data to display  Initial Impression / Assessment and Plan / UC Course  I have reviewed the triage vital signs and the nursing notes.  Pertinent labs & imaging results that were available during  my care of the patient were reviewed by me and considered in my medical decision making (see chart for details).    Patient has pain in her left leg.  I believe it is from sciatica.  We will treat her with prednisone and pain management.  I showed her her x-rays.  She does have degenerative disc disease at L4-5 and L5-S1.  This could contribute to sciatica.  She does have an orthopedist to follow-up with if she fails to improve Final Clinical Impressions(s) / UC Diagnoses   Final diagnoses:  Sciatica of left side  Arthralgia of left lower leg     Discharge Instructions     Get plenty of rest.  Activity as tolerated Take the Medrol Dosepak as directed.  This is a prednisone medicine to take down swelling and inflammation.  Take all of day 1 today Take tramadol as needed for pain.  This can cause drowsiness.  Do not take tramadol and drive Call your orthopedic if you are not improving by next week   ED Prescriptions    Medication Sig Dispense Auth. Provider   methylPREDNISolone (MEDROL DOSEPAK) 4 MG TBPK tablet tad 21 tablet Eustace MooreNelson, Naliyah Neth Sue, MD   traMADol (ULTRAM) 50 MG tablet Take 1 tablet (50 mg total) by mouth every 6 (six) hours as needed. 15 tablet Eustace MooreNelson, Marelyn Rouser Sue, MD     Controlled Substance Prescriptions [place] Controlled Substance Registry consulted? Yes, I have consulted the  Controlled Substances Registry for this patient, and feel the risk/benefit ratio today is favorable for proceeding with this prescription for a controlled substance.   Eustace MooreNelson, Nazanin Kinner Sue, MD 12/23/18 [id number]

## 2018-12-23 NOTE — ED Triage Notes (Signed)
Pt c/o L upper hip and leg pain for a week and a half, seems to be worse with movement. Pt states she had two hip replacements but its more in her leg than hip. Ambulatory.

## 2018-12-23 NOTE — Discharge Instructions (Addendum)
Get plenty of rest.  Activity as tolerated Take the Medrol Dosepak as directed.  This is a prednisone medicine to take down swelling and inflammation.  Take all of day 1 today Take tramadol as needed for pain.  This can cause drowsiness.  Do not take tramadol and drive Call your orthopedic if you are not improving by next week

## 2019-02-09 ENCOUNTER — Encounter (HOSPITAL_COMMUNITY): Payer: Self-pay

## 2019-02-09 ENCOUNTER — Ambulatory Visit (HOSPITAL_COMMUNITY)
Admission: EM | Admit: 2019-02-09 | Discharge: 2019-02-09 | Disposition: A | Discharge status: Home / Self Care | Payer: Medicare Other | Attending: Family Medicine | Admitting: Family Medicine

## 2019-02-09 ENCOUNTER — Other Ambulatory Visit: Payer: Self-pay

## 2019-02-09 ENCOUNTER — Ambulatory Visit (INDEPENDENT_AMBULATORY_CARE_PROVIDER_SITE_OTHER): Payer: Medicare Other

## 2019-02-09 DIAGNOSIS — S99921A Unspecified injury of right foot, initial encounter: Secondary | ICD-10-CM | POA: Diagnosis not present

## 2019-02-09 DIAGNOSIS — M109 Gout, unspecified: Secondary | ICD-10-CM | POA: Diagnosis not present

## 2019-02-09 DIAGNOSIS — M79671 Pain in right foot: Secondary | ICD-10-CM | POA: Diagnosis not present

## 2019-02-09 MED ORDER — PREDNISONE 10 MG (21) PO TBPK: ORAL_TABLET | ORAL | 0 refills | Status: DC

## 2019-02-09 MED ORDER — TRAMADOL HCL 50 MG PO TABS: 50.0000 mg | ORAL_TABLET | Freq: Two times a day (BID) | ORAL | 0 refills | Status: AC | PRN

## 2019-02-09 NOTE — Discharge Instructions (Addendum)
Treating you for an acute gout flare.  Your x-ray was normal Treating you with prednisone taper over the next 6 days.  Make sure you take this with food. Tramadol for severe pain Follow up as needed for continued or worsening symptoms

## 2019-02-09 NOTE — ED Triage Notes (Signed)
Pt presents with right foot pain after a fall on Friday.

## 2019-02-09 NOTE — ED Provider Notes (Signed)
[place]    CSN: [id number] Arrival date & time: 02/09/19  2979     History   Chief Complaint Chief Complaint  Patient presents with  . Foot Pain    HPI Patricia Armstrong is a 68 y.o. female.   Pt is a 68 year old female that presents with right foot pain.  Patient reporting a fall that occurred approximately 1 week ago.  She is unsure of how she landed but reports she was wearing flip-flops outside and tripped.  Reported that initially for the first couple days after the fall she was feeling okay and ambulating without problem.  Over the last 2 days she has developed severe right foot pain near the great toe with redness and swelling.  Reporting that just that sheets touching her foot hurts.  She has not taken anything for the pain.  Denies any associated fever, chills, body aches or weakness. Denies any hx of Gout.   ROS per HPI      History reviewed. No pertinent past medical history.  There are no active problems to display for this patient.   Past Surgical History:  Procedure Laterality Date  . HIP SURGERY      OB History   No obstetric history on file.      Home Medications    Prior to Admission medications   Medication Sig Start Date End Date Taking? Authorizing Provider  predniSONE (STERAPRED UNI-PAK 21 TAB) 10 MG (21) TBPK tablet 6 tabs for 1 day, then 5 tabs for 1 das, then 4 tabs for 1 day, then 3 tabs for 1 day, 2 tabs for 1 day, then 1 tab for 1 day 02/09/19   Loura Halt A, NP  traMADol (ULTRAM) 50 MG tablet Take 1 tablet (50 mg total) by mouth every 12 (twelve) hours as needed for up to 3 days. 02/09/19 02/12/19  Loura Halt A, NP  diphenhydrAMINE (BENADRYL) 25 MG tablet Take 1 tablet (25 mg total) by mouth every 6 (six) hours. 09/04/17 12/23/18  Zigmund Gottron, NP  famotidine (PEPCID) 20 MG tablet Take 1 tablet (20 mg total) by mouth 2 (two) times daily. 09/04/17 12/23/18  Zigmund Gottron, NP    Family History Family History  Family  history unknown: Yes    Social History Social History   Tobacco Use  . Smoking status: Never Smoker  . Smokeless tobacco: Never Used  Substance Use Topics  . Alcohol use: No  . Drug use: Not on file     Allergies   Penicillins   Review of Systems Review of Systems   Physical Exam Triage Vital Signs ED Triage Vitals [02/09/19 0840]  Enc Vitals Group     BP 122/82     Pulse Rate 84     Resp 17     Temp 98.2 F (36.8 C)     Temp Source Oral     SpO2 98 %     Weight      Height      Head Circumference      Peak Flow      Pain Score 8     Pain Loc      Pain Edu?      Excl. in [place]?    No data found.  Updated Vital Signs BP 122/82 (BP Location: Left Arm)   Pulse 84   Temp 98.2 F (36.8 C) (Oral)   Resp 17   SpO2 98%   Visual Acuity Right Eye Distance:  Left Eye Distance:   Bilateral Distance:    Right Eye Near:   Left Eye Near:    Bilateral Near:     Physical Exam Vitals signs and nursing note reviewed.  Constitutional:      General: She is not in acute distress.    Appearance: Normal appearance. She is not ill-appearing, toxic-appearing or diaphoretic.  HENT:     Head: Normocephalic and atraumatic.     Nose: Nose normal.  Eyes:     Conjunctiva/sclera: Conjunctivae normal.  Neck:     Musculoskeletal: Normal range of motion.  Pulmonary:     Effort: Pulmonary effort is normal.  Musculoskeletal: Normal range of motion.       Feet:     Comments: Erythema, swelling and tenderness to right foot near great toe proximally at the metacarpal joint  Skin:    General: Skin is warm and dry.  Neurological:     Mental Status: She is alert.  Psychiatric:        Mood and Affect: Mood normal.      UC Treatments / Results  Labs (all labs ordered are listed, but only abnormal results are displayed) Labs Reviewed - No data to display  EKG   Radiology Dg Foot Complete Right  Result Date: 02/09/2019 CLINICAL DATA:  Right foot pain for 6 days  status post fall. EXAM: RIGHT FOOT COMPLETE - 3+ VIEW COMPARISON:  None. FINDINGS: There is no evidence of fracture or dislocation. Soft tissues are unremarkable. IMPRESSION: No acute fracture or dislocation noted. Electronically Signed   By: Sherian ReinWei-Chen  Lin M.D.   On: 02/09/2019 09:49    Procedures Procedures (including critical care time)  Medications Ordered in UC Medications - No data to display  Initial Impression / Assessment and Plan / UC Course  I have reviewed the triage vital signs and the nursing notes.  Pertinent labs & imaging results that were available during my care of the patient were reviewed by me and considered in my medical decision making (see chart for details).     X-ray negative for any acute fracture.  Symptoms consistent with gout We will treat with prednisone taper Recommend to rest and stay off the foot Follow up as needed for continued or worsening symptoms   Final Clinical Impressions(s) / UC Diagnoses   Final diagnoses:  Acute gout of right foot, unspecified cause     Discharge Instructions     Treating you for an acute gout flare.  Your x-ray was normal Treating you with prednisone taper over the next 6 days.  Make sure you take this with food. Tramadol for severe pain Follow up as needed for continued or worsening symptoms     ED Prescriptions    Medication Sig Dispense Auth. Provider   predniSONE (STERAPRED UNI-PAK 21 TAB) 10 MG (21) TBPK tablet 6 tabs for 1 day, then 5 tabs for 1 das, then 4 tabs for 1 day, then 3 tabs for 1 day, 2 tabs for 1 day, then 1 tab for 1 day 21 tablet Zanasia Hickson A, NP   traMADol (ULTRAM) 50 MG tablet Take 1 tablet (50 mg total) by mouth every 12 (twelve) hours as needed for up to 3 days. 6 tablet Dahlia ByesBast, Korin Setzler A, NP     Controlled Substance Prescriptions [place] Controlled Substance Registry consulted? Not Applicable   Janace ArisBast, Admire Bunnell A, NP 02/09/19 1433

## 2019-10-12 ENCOUNTER — Ambulatory Visit: Payer: Medicare Other

## 2019-10-27 IMAGING — DX LUMBAR SPINE - COMPLETE 4+ VIEW
5 series · 5 of 5 positions shown · non-contrast
Comparison: Prior lumbar spine radiographs 12/08/2015

CLINICAL DATA: 67-year-old female with lumbar spine pain and left
lower extremity radiculopathy.

EXAM:
LUMBAR SPINE - COMPLETE 4+ VIEW

[l-spine ap]
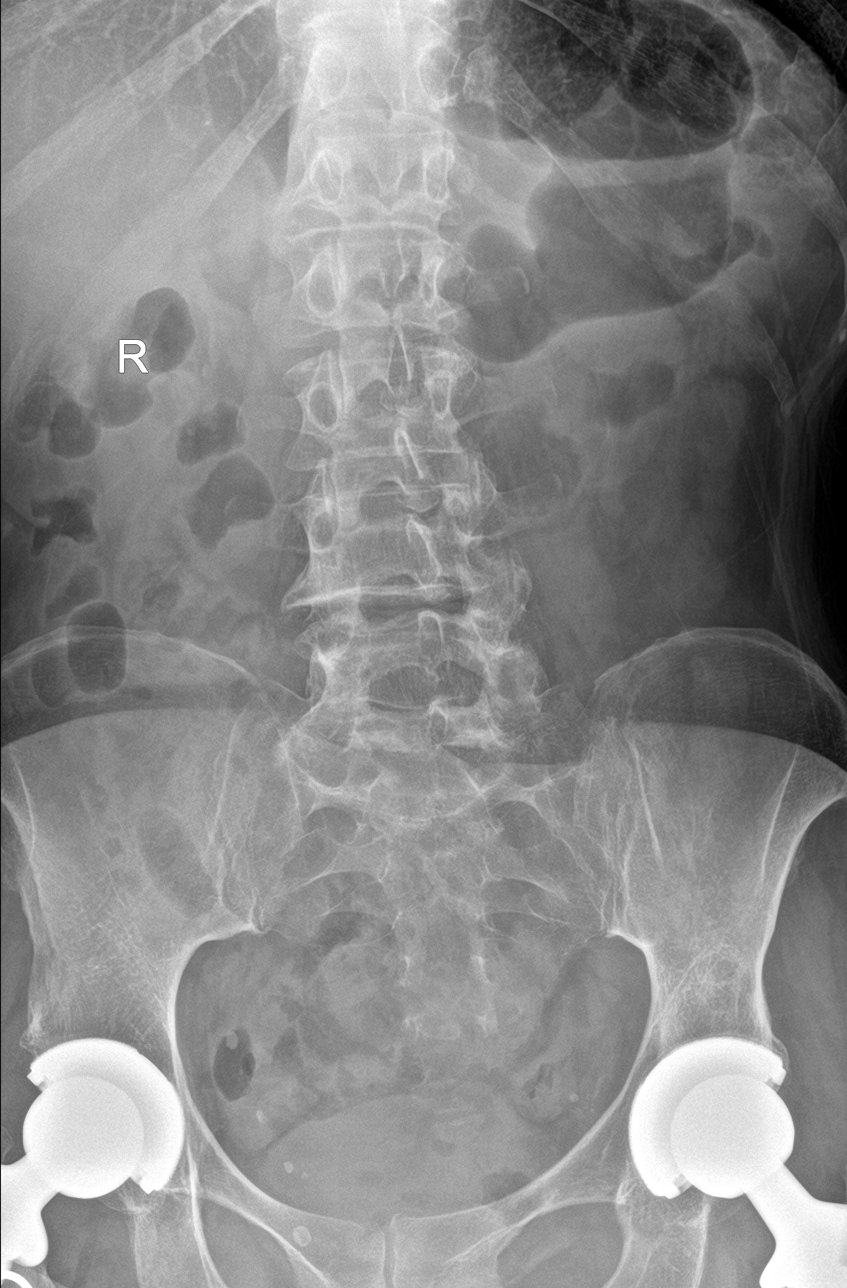

[l-spine obl (1 of 2)]
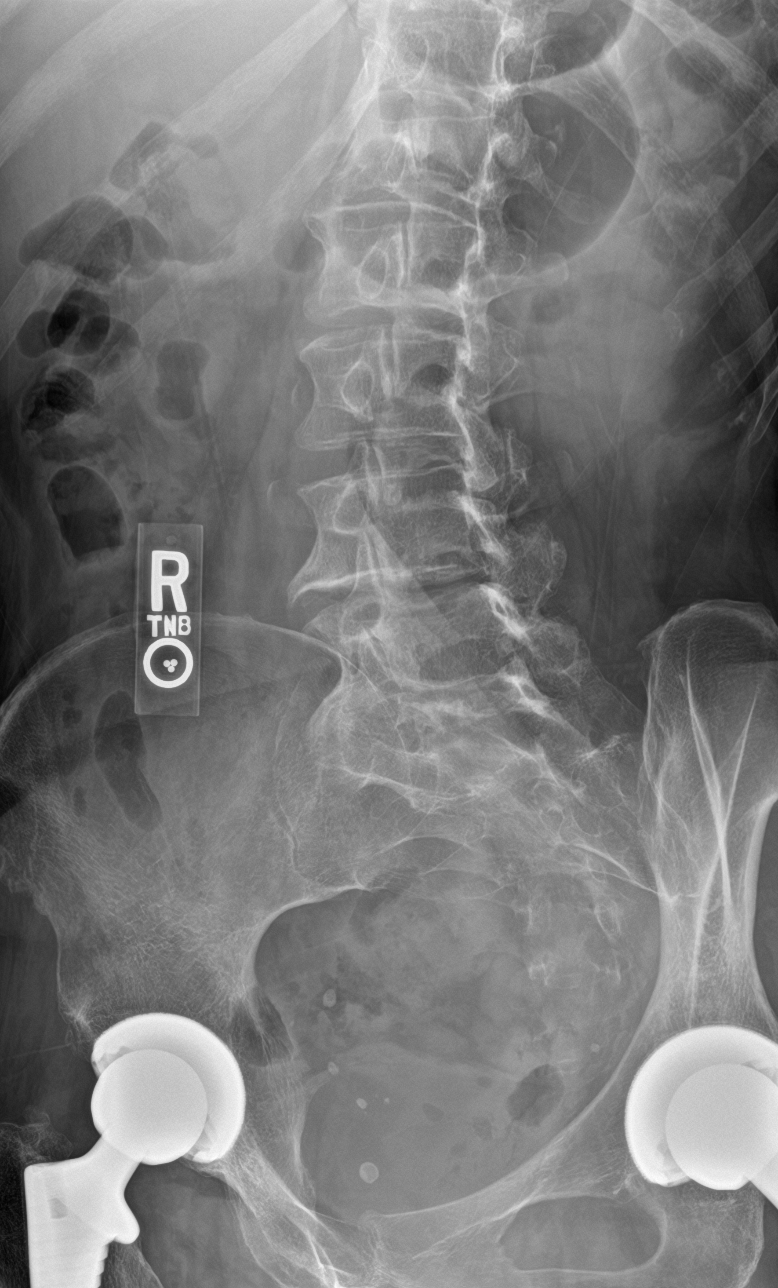

[l-spine obl (2 of 2)]
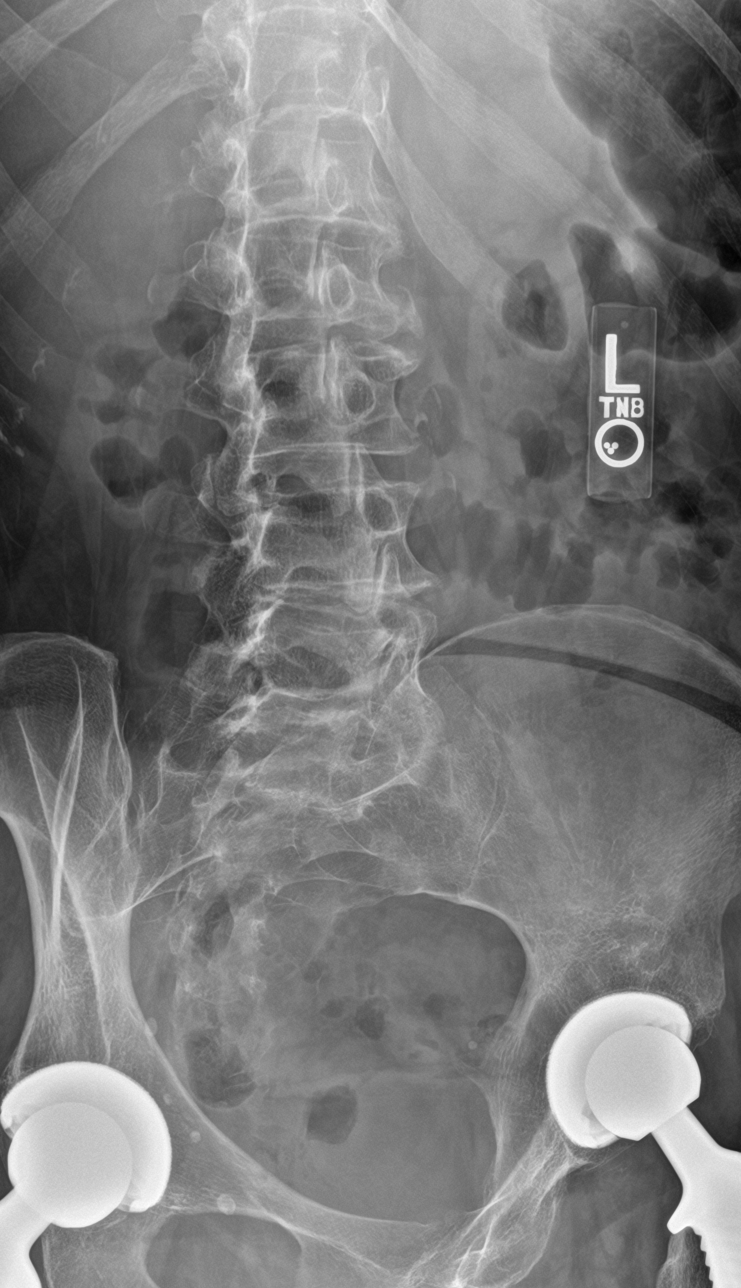

[l-spine lat]
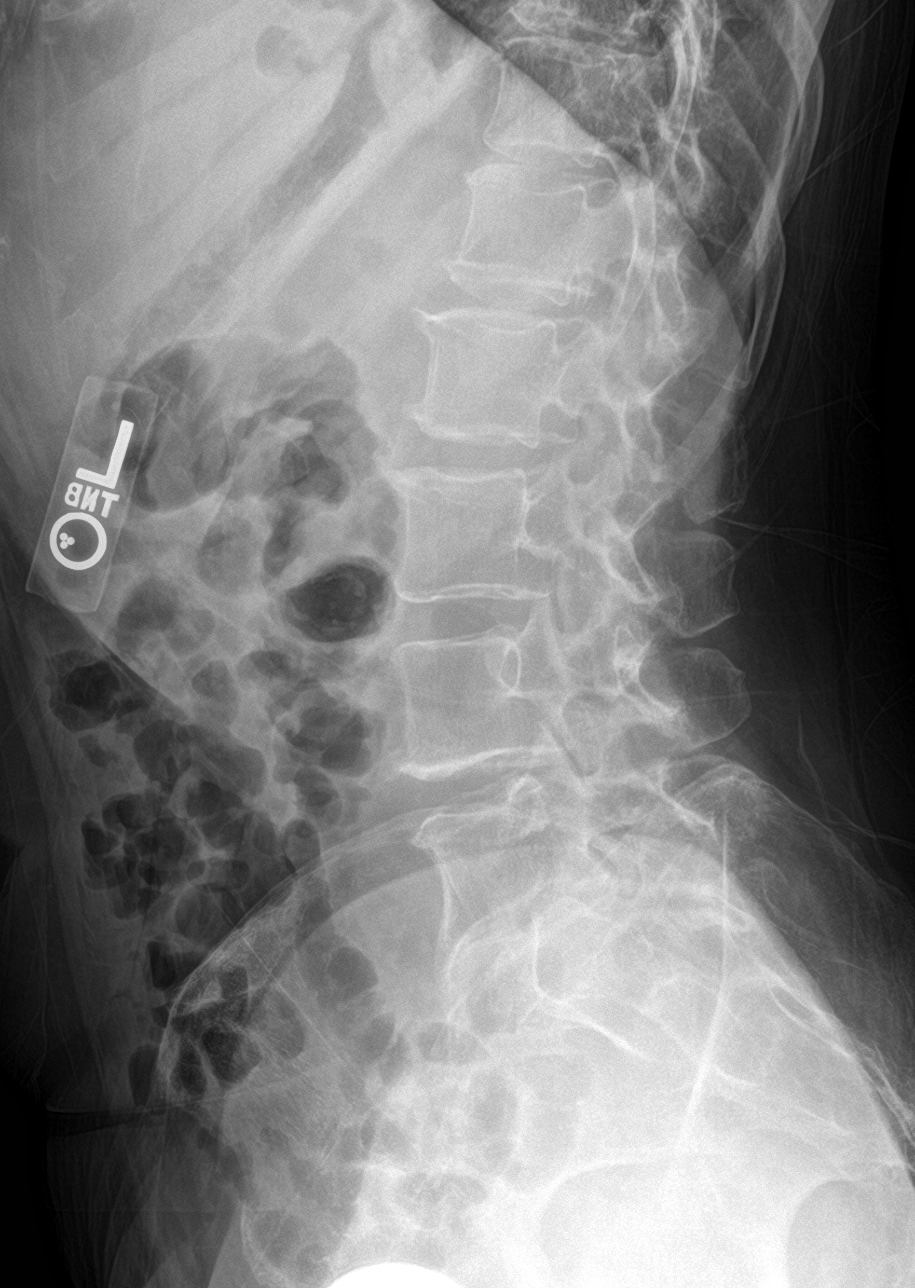

[l-spine spot]
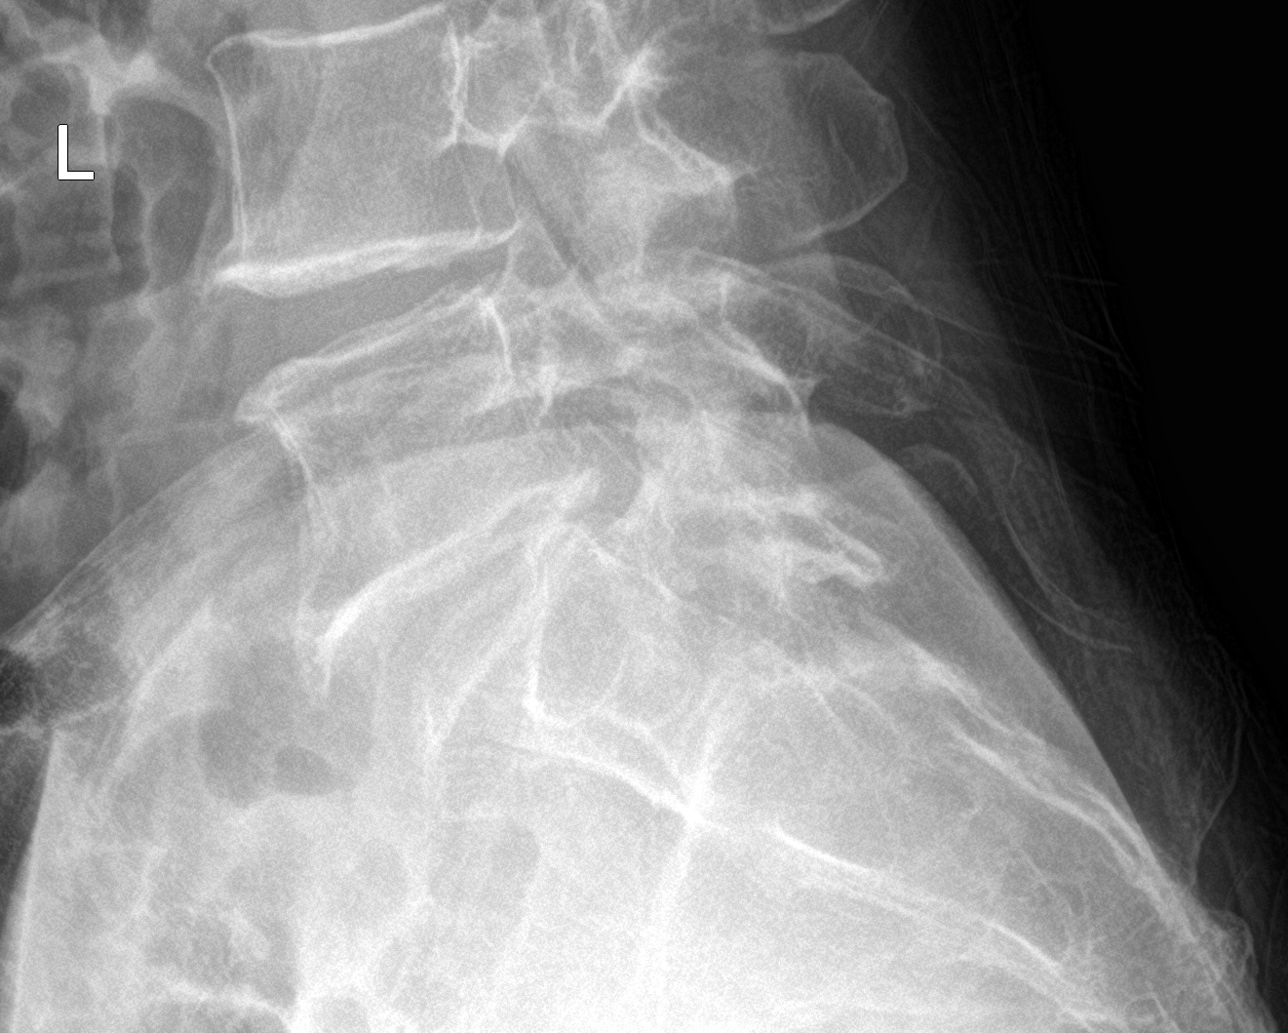

[5 of 5 positions shown; findings below may reference images not displayed]

FINDINGS: No evidence of acute fracture or malalignment. The vertebral body
heights are maintained. Focal degenerative disc disease is present
at L5-S1, and to a lesser extent at L4-L5. There has been mild
interval progression of degenerative change compared to 12/08/2015
with increased disc space loss and endplate sclerosis. Facet
arthropathy is also present at L5-S1. No lytic or blastic osseous
lesion. Bilateral hip arthroplasty prostheses.
IMPRESSION: 1. L5-S1 degenerative disc disease and bilateral facet arthropathy
slightly progressed compared to 12/08/2015.

## 2019-11-06 ENCOUNTER — Ambulatory Visit (HOSPITAL_COMMUNITY)
Admission: EM | Admit: 2019-11-06 | Discharge: 2019-11-06 | Disposition: A | Discharge status: Home / Self Care | Payer: Medicare Other | Attending: Family Medicine | Admitting: Family Medicine

## 2019-11-06 ENCOUNTER — Other Ambulatory Visit: Payer: Self-pay

## 2019-11-06 DIAGNOSIS — R52 Pain, unspecified: Secondary | ICD-10-CM | POA: Insufficient documentation

## 2019-11-06 DIAGNOSIS — R519 Headache, unspecified: Secondary | ICD-10-CM | POA: Diagnosis not present

## 2019-11-06 DIAGNOSIS — Z1152 Encounter for screening for COVID-19: Secondary | ICD-10-CM | POA: Diagnosis not present

## 2019-11-06 DIAGNOSIS — R6883 Chills (without fever): Secondary | ICD-10-CM | POA: Insufficient documentation

## 2019-11-06 NOTE — ED Provider Notes (Signed)
[facility]    CSN: [id number] Arrival date & time: 11/06/19  0805      History   Chief Complaint Chief Complaint  Patient presents with  . Generalized Body Aches    HPI Patricia Armstrong is a 69 y.o. female.   Patient reports that she has been experiencing body aches and chills for the last 5 days.  Reports that she received her second dose of the Pfizer Covid vaccine 5 days ago as well.  Reports that she has been taking Tylenol and Aleve with temporary relief of body aches.  Endorses headache that is temporary relief as well with over-the-counter medications.  Denies nausea, vomiting, diarrhea, rash, fever, other symptoms.  Per chart review, patient has no significant medical history.  ROS per HPI  The history is provided by the patient.    No past medical history on file.  There are no problems to display for this patient.   Past Surgical History:  Procedure Laterality Date  . HIP SURGERY      OB History   No obstetric history on file.      Home Medications    Prior to Admission medications   Medication Sig Start Date End Date Taking? Authorizing Provider  diphenhydrAMINE (BENADRYL) 25 MG tablet Take 1 tablet (25 mg total) by mouth every 6 (six) hours. 09/04/17 12/23/18  Georgetta Haber, NP  famotidine (PEPCID) 20 MG tablet Take 1 tablet (20 mg total) by mouth 2 (two) times daily. 09/04/17 12/23/18  Georgetta Haber, NP    Family History Family History  Family history unknown: Yes    Social History Social History   Tobacco Use  . Smoking status: Never Smoker  . Smokeless tobacco: Never Used  Substance Use Topics  . Alcohol use: No  . Drug use: Not on file     Allergies   Penicillins   Review of Systems Review of Systems   Physical Exam Triage Vital Signs ED Triage Vitals  Enc Vitals Group     BP      Pulse      Resp      Temp      Temp src      SpO2      Weight      Height      Head Circumference      Peak Flow    Pain Score      Pain Loc      Pain Edu?      Excl. in [place]?    No data found.  Updated Vital Signs BP 103/66   Pulse 89   Temp 97.6 F (36.4 C)   Resp 16   SpO2 99%   Visual Acuity Right Eye Distance:   Left Eye Distance:   Bilateral Distance:    Right Eye Near:   Left Eye Near:    Bilateral Near:     Physical Exam Vitals and nursing note reviewed.  Constitutional:      General: She is not in acute distress.    Appearance: Normal appearance. She is well-developed and normal weight. She is ill-appearing.  HENT:     Head: Normocephalic and atraumatic.     Right Ear: Tympanic membrane normal.     Left Ear: Tympanic membrane normal.     Nose: Nose normal.     Mouth/Throat:     Mouth: Mucous membranes are moist.     Pharynx: Oropharynx is clear.  Eyes:     Extraocular  Movements: Extraocular movements intact.     Conjunctiva/sclera: Conjunctivae normal.     Pupils: Pupils are equal, round, and reactive to light.  Cardiovascular:     Rate and Rhythm: Normal rate and regular rhythm.     Heart sounds: Normal heart sounds. No murmur.  Pulmonary:     Effort: Pulmonary effort is normal. No respiratory distress.     Breath sounds: Normal breath sounds. No stridor. No wheezing, rhonchi or rales.  Chest:     Chest wall: No tenderness.  Abdominal:     General: Bowel sounds are normal.     Palpations: Abdomen is soft.     Tenderness: There is no abdominal tenderness.  Musculoskeletal:        General: Normal range of motion.     Cervical back: Normal range of motion and neck supple.  Lymphadenopathy:     Cervical: No cervical adenopathy.  Skin:    General: Skin is warm and dry.     Capillary Refill: Capillary refill takes less than 2 seconds.  Neurological:     General: No focal deficit present.     Mental Status: She is alert and oriented to person, place, and time.  Psychiatric:        Mood and Affect: Mood normal.        Behavior: Behavior normal.        Thought  Content: Thought content normal.      UC Treatments / Results  Labs (all labs ordered are listed, but only abnormal results are displayed) Labs Reviewed  SARS CORONAVIRUS 2 (TAT 6-24 HRS)    EKG   Radiology No results found.  Procedures Procedures (including critical care time)  Medications Ordered in UC Medications - No data to display  Initial Impression / Assessment and Plan / UC Course  I have reviewed the triage vital signs and the nursing notes.  Pertinent labs & imaging results that were available during my care of the patient were reviewed by me and considered in my medical decision making (see chart for details).     Chills/aches/HA: Presents with chills, body aches, headache since her second dose of the Pfizer Covid vaccine 5 days ago.  Has been taking Tylenol and Aleve with temporary relief of aches and headaches.  On exam, lungs CTA bilaterally.  No oropharyngeal erythema, tonsillar swelling noted.  Afebrile in office today.  Negative for congestion and rhinorrhea as well. Covid swab obtained in office today.  Patient instructed to quarantine until results are back and negative.  If results are negative, patient may resume daily schedule as tolerated once they are fever free for 24 hours without the use of antipyretic medications.  If results are positive, patient instructed to quarantine 10 days from today.  Patient instructed to follow-up with primary care with this office as needed.  Patient instructed to follow-up in the ER for trouble swallowing, trouble breathing, other concerning symptoms.  Final Clinical Impressions(s) / UC Diagnoses   Final diagnoses:  Nonintractable headache, unspecified chronicity pattern, unspecified headache type  Chills  Body aches  Encounter for screening for COVID-19     Discharge Instructions     Your COVID test is pending.  You should self quarantine until the test result is back.    Take Tylenol as needed for fever or  discomfort.  Rest and keep yourself hydrated.    Go to the emergency department if you develop shortness of breath, severe diarrhea, high fever not relieved by Tylenol or  ibuprofen, or other concerning symptoms.       ED Prescriptions    None     PDMP not reviewed this encounter.   Faustino Congress, NP 11/06/19 [phone]

## 2019-11-06 NOTE — ED Triage Notes (Signed)
Pt had second dose of COVID vaccine on Thursday, is here for body aches and chills.

## 2019-11-06 NOTE — Discharge Instructions (Addendum)
Your COVID test is pending.  You should self quarantine until the test result is back.    Take Tylenol as needed for fever or discomfort.  Rest and keep yourself hydrated.    Go to the emergency department if you develop shortness of breath, severe diarrhea, high fever not relieved by Tylenol or ibuprofen, or other concerning symptoms.    

## 2019-11-07 LAB — SARS CORONAVIRUS 2 (TAT 6-24 HRS): SARS Coronavirus 2: NEGATIVE

## 2019-11-24 ENCOUNTER — Encounter (HOSPITAL_COMMUNITY): Payer: Self-pay

## 2019-11-24 ENCOUNTER — Ambulatory Visit (HOSPITAL_COMMUNITY)
Admission: EM | Admit: 2019-11-24 | Discharge: 2019-11-24 | Disposition: A | Discharge status: Home / Self Care | Payer: Medicare Other | Attending: Urgent Care | Admitting: Urgent Care

## 2019-11-24 ENCOUNTER — Other Ambulatory Visit: Payer: Self-pay

## 2019-11-24 DIAGNOSIS — L03115 Cellulitis of right lower limb: Secondary | ICD-10-CM

## 2019-11-24 DIAGNOSIS — R21 Rash and other nonspecific skin eruption: Secondary | ICD-10-CM

## 2019-11-24 DIAGNOSIS — L259 Unspecified contact dermatitis, unspecified cause: Secondary | ICD-10-CM

## 2019-11-24 MED ORDER — DOXYCYCLINE HYCLATE 100 MG PO CAPS: 100.0000 mg | ORAL_CAPSULE | Freq: Two times a day (BID) | ORAL | 0 refills | Status: DC

## 2019-11-24 MED ORDER — HYDROXYZINE HCL 25 MG PO TABS: 12.5000 mg | ORAL_TABLET | Freq: Three times a day (TID) | ORAL | 0 refills | Status: DC | PRN

## 2019-11-24 NOTE — ED Provider Notes (Signed)
  [facility]   MRN: [id number] DOB: 1950/10/09  Subjective:   Patricia Armstrong is a 69 y.o. female presenting for 2-week history of persistent and worsening itchy and painful rash over her right lower leg.  Has had similar lesions to a smaller extent just superior to the larger wound and over the left lateral forearm.  Has been using peroxide and Neosporin over the area.  Denies fall, trauma, known insect bite or sting.  Has not done any yard work or come to contact poisonous plants.  Symptoms started out with itching but progressed to the painful rash that she has now.  Denies taking chronic medications.  Allergies  Allergen Reactions  . Penicillins     History reviewed. No pertinent past medical history.   Past Surgical History:  Procedure Laterality Date  . HIP SURGERY      Family History  Family history unknown: Yes    Social History   Tobacco Use  . Smoking status: Never Smoker  . Smokeless tobacco: Never Used  Substance Use Topics  . Alcohol use: No  . Drug use: Not on file    ROS   Objective:   Vitals: BP 135/90 (BP Location: Left Arm)   Pulse 85   Temp 98.8 F (37.1 C) (Oral)   Resp 18   SpO2 98%   Physical Exam Constitutional:      General: She is not in acute distress.    Appearance: Normal appearance. She is well-developed. She is not ill-appearing.  HENT:     Head: Normocephalic and atraumatic.     Nose: Nose normal.     Mouth/Throat:     Mouth: Mucous membranes are moist.     Pharynx: Oropharynx is clear.  Eyes:     General: No scleral icterus.       Right eye: No discharge.        Left eye: No discharge.     Extraocular Movements: Extraocular movements intact.     Pupils: Pupils are equal, round, and reactive to light.  Cardiovascular:     Rate and Rhythm: Normal rate.  Pulmonary:     Effort: Pulmonary effort is normal.  Skin:    General: Skin is warm and dry.     Findings: Rash (>10cm area of erythema with weeping and  warmth over right lower leg) present.  Neurological:     General: No focal deficit present.     Mental Status: She is alert and oriented to person, place, and time.  Psychiatric:        Mood and Affect: Mood normal.        Behavior: Behavior normal.        Thought Content: Thought content normal.        Judgment: Judgment normal.             Assessment and Plan :   PDMP not reviewed this encounter.  1. Cellulitis of right lower extremity   2. Rash and nonspecific skin eruption   3. Contact dermatitis, unspecified contact dermatitis type, unspecified trigger     Suspect rash started out as a contact dermatitis to an unknown offending agent.  Counseled that it is likely worsened his cellulitis and therefore will use doxycycline to cover for this.  Hydroxyzine for itching.  Counseled on wound care. Counseled patient on potential for adverse effects with medications prescribed/recommended today, ER and return-to-clinic precautions discussed, patient verbalized understanding.    Wallis Bamberg, [place] 11/24/19 1806

## 2019-11-24 NOTE — ED Triage Notes (Signed)
Pt c/o wound to right lateral calf area for approx 2 weeks. Pt states that wound began as a small raised/itching area and increased in size. Wound approx 1.5inches long by 1 inch wide, purple in color, draining clear/yellow exudate. Pt also reports small raised vesicular area to lateral right knee and left elbow. Denies fever, n/v/d, chills.

## 2019-11-24 NOTE — Discharge Instructions (Signed)
Please change your dressing 2-3 times daily and use non-stick (non-adherent) dressings secured with Coban. Do not apply any ointments or creams. Each time you change your dressing, make sure you clean gently around the perimeter of the wound with gentle soap and warm water. Pat your wound dry and let it air out if possible for 1-2 hours before reapplying another dressing. You can stop applying dressings once your wound stops weeping. It is okay to use Aquaphor to help your skin retain moisture once your infection is healed.

## 2019-12-01 ENCOUNTER — Other Ambulatory Visit: Payer: Self-pay

## 2019-12-01 ENCOUNTER — Ambulatory Visit (HOSPITAL_COMMUNITY)
Admission: EM | Admit: 2019-12-01 | Discharge: 2019-12-01 | Disposition: A | Discharge status: Home / Self Care | Payer: Medicare Other | Attending: Family Medicine | Admitting: Family Medicine

## 2019-12-01 ENCOUNTER — Encounter (HOSPITAL_COMMUNITY): Payer: Self-pay

## 2019-12-01 DIAGNOSIS — L500 Allergic urticaria: Secondary | ICD-10-CM | POA: Diagnosis not present

## 2019-12-01 MED ORDER — PREDNISONE 10 MG (21) PO TBPK: ORAL_TABLET | ORAL | 0 refills | Status: DC

## 2019-12-01 NOTE — ED Provider Notes (Signed)
[place]    CSN: [id number] Arrival date & time: 12/01/19  0805      History   Chief Complaint Chief Complaint  Patient presents with  . Follow-up  . Rash    HPI Patricia Armstrong is a 69 y.o. female.   Patient is a 69 year old female presents today for possible allergic reaction.  Here today with generalized hives and erythema to abdominal area and neck area.  Was seen here on 11/24/2019 and treated for cellulitis with doxycycline.  And given hydroxyzine for itching.  This has improved with the medication she just finished a 7 of doxycycline.  Concerned about possible reaction to the medication.  The rash is very itchy and tender especially to the abdominal area.   Denies any fever, joint pain. Denies any recent changes in lotions, detergents, foods or other possible irritants. No recent travel. Nobody else at home has the rash. Patient has been outside but denies any contact with plants or insects. No new foods or medications.   ROS per HPI      History reviewed. No pertinent past medical history.  There are no problems to display for this patient.   Past Surgical History:  Procedure Laterality Date  . HIP SURGERY      OB History   No obstetric history on file.      Home Medications    Prior to Admission medications   Medication Sig Start Date End Date Taking? Authorizing Provider  hydrOXYzine (ATARAX/VISTARIL) 25 MG tablet Take 0.5-1 tablets (12.5-25 mg total) by mouth every 8 (eight) hours as needed for itching. 11/24/19   Jaynee Eagles, PA-C  predniSONE (STERAPRED UNI-PAK 21 TAB) 10 MG (21) TBPK tablet 6 tabs for 1 day, then 5 tabs for 1 das, then 4 tabs for 1 day, then 3 tabs for 1 day, 2 tabs for 1 day, then 1 tab for 1 day 12/01/19   Loura Halt A, NP  diphenhydrAMINE (BENADRYL) 25 MG tablet Take 1 tablet (25 mg total) by mouth every 6 (six) hours. 09/04/17 12/23/18  Zigmund Gottron, NP  famotidine (PEPCID) 20 MG tablet Take 1 tablet (20 mg total)  by mouth 2 (two) times daily. 09/04/17 12/23/18  Zigmund Gottron, NP    Family History Family History  Family history unknown: Yes    Social History Social History   Tobacco Use  . Smoking status: Never Smoker  . Smokeless tobacco: Never Used  Substance Use Topics  . Alcohol use: No  . Drug use: Not on file     Allergies   Penicillins   Review of Systems Review of Systems   Physical Exam Triage Vital Signs ED Triage Vitals  Enc Vitals Group     BP 12/01/19 0819 119/78     Pulse Rate 12/01/19 0819 (!) 16     Resp --      Temp 12/01/19 0819 98.3 F (36.8 C)     Temp Source 12/01/19 0819 Oral     SpO2 12/01/19 0819 100 %     Weight --      Height --      Head Circumference --      Peak Flow --      Pain Score 12/01/19 0829 1     Pain Loc --      Pain Edu? --      Excl. in [place]? --    No data found.  Updated Vital Signs BP 119/78 (BP Location: Left Arm)  Pulse (!) 16   Temp 98.3 F (36.8 C) (Oral)   SpO2 100%   Visual Acuity Right Eye Distance:   Left Eye Distance:   Bilateral Distance:    Right Eye Near:   Left Eye Near:    Bilateral Near:     Physical Exam Vitals and nursing note reviewed.  Constitutional:      General: She is not in acute distress.    Appearance: Normal appearance. She is not ill-appearing, toxic-appearing or diaphoretic.  HENT:     Head: Normocephalic.     Nose: Nose normal.  Eyes:     Conjunctiva/sclera: Conjunctivae normal.  Pulmonary:     Effort: Pulmonary effort is normal.  Musculoskeletal:        General: Normal range of motion.     Cervical back: Normal range of motion.  Skin:    General: Skin is warm and dry.     Findings: No rash.     Comments: Generalized urticaria and erythema to entire trunk Some erythematous papules to surrounding neck area. Other areas in previous pictures from visit have improved and are healing.  Neurological:     Mental Status: She is alert.  Psychiatric:        Mood and Affect:  Mood normal.      UC Treatments / Results  Labs (all labs ordered are listed, but only abnormal results are displayed) Labs Reviewed - No data to display  EKG   Radiology No results found.  Procedures Procedures (including critical care time)  Medications Ordered in UC Medications - No data to display  Initial Impression / Assessment and Plan / UC Course  I have reviewed the triage vital signs and the nursing notes.  Pertinent labs & imaging results that were available during my care of the patient were reviewed by me and considered in my medical decision making (see chart for details).      Allergic urticaria Most likely reaction to antibiotic. We will have her stop the antibiotic.  She is already completed a week's worth of antibiotics and the symptoms have seem to have mostly resolved.  We will have her continue hydroxyzine as needed and start on prednisone taper. Strict return precautions given Final Clinical Impressions(s) / UC Diagnoses   Final diagnoses:  Allergic urticaria     Discharge Instructions     Stop the antibiotics  Start the prednisone.  Keep taking the hydroxyzine as needed.  Please return for any continued or worsening problems    ED Prescriptions    Medication Sig Dispense Auth. Provider   predniSONE (STERAPRED UNI-PAK 21 TAB) 10 MG (21) TBPK tablet 6 tabs for 1 day, then 5 tabs for 1 das, then 4 tabs for 1 day, then 3 tabs for 1 day, 2 tabs for 1 day, then 1 tab for 1 day 21 tablet Kyrstyn Greear A, NP     PDMP not reviewed this encounter.   Dahlia Byes A, NP 12/01/19 1055

## 2019-12-01 NOTE — ED Triage Notes (Signed)
Pt presents with rash in multiple areas of her body; pt had an initial visit about a week ago for a few areas on arms and legs but now it has progressed and worsened.  Pt is unsure of origin of rash.

## 2019-12-01 NOTE — Discharge Instructions (Addendum)
Stop the antibiotics  Start the prednisone.  Keep taking the hydroxyzine as needed.  Please return for any continued or worsening problems

## 2019-12-15 ENCOUNTER — Encounter (HOSPITAL_COMMUNITY): Payer: Self-pay

## 2019-12-15 ENCOUNTER — Ambulatory Visit (HOSPITAL_COMMUNITY)
Admission: EM | Admit: 2019-12-15 | Discharge: 2019-12-15 | Disposition: A | Discharge status: Home / Self Care | Payer: Medicare Other | Attending: Family Medicine | Admitting: Family Medicine

## 2019-12-15 ENCOUNTER — Other Ambulatory Visit: Payer: Self-pay

## 2019-12-15 DIAGNOSIS — T7840XD Allergy, unspecified, subsequent encounter: Secondary | ICD-10-CM

## 2019-12-15 DIAGNOSIS — L509 Urticaria, unspecified: Secondary | ICD-10-CM | POA: Diagnosis not present

## 2019-12-15 DIAGNOSIS — B354 Tinea corporis: Secondary | ICD-10-CM | POA: Diagnosis not present

## 2019-12-15 MED ORDER — CLOTRIMAZOLE-BETAMETHASONE 1-0.05 % EX CREA: TOPICAL_CREAM | CUTANEOUS | 0 refills | Status: DC

## 2019-12-15 MED ORDER — METHYLPREDNISOLONE SODIUM SUCC 125 MG IJ SOLR
INTRAMUSCULAR | Status: AC
  Filled 2019-12-15: qty 2

## 2019-12-15 MED ORDER — METHYLPREDNISOLONE SODIUM SUCC 125 MG IJ SOLR
80.0000 mg | Freq: Once | INTRAMUSCULAR | Status: AC
  Administered 2019-12-15: 80 mg via INTRAMUSCULAR

## 2019-12-15 MED ORDER — PREDNISONE 20 MG PO TABS: ORAL_TABLET | ORAL | 0 refills | Status: DC

## 2019-12-15 NOTE — Discharge Instructions (Signed)
Take the prednisone as directed Use the lotrisone lotion 2 x a day to all affected areas Continue using until area is completely clear Return if not seeing improvement in one week See primary care for routine follow up

## 2019-12-15 NOTE — ED Provider Notes (Signed)
[facility]    CSN: [id number] Arrival date & time: 12/15/19  0803      History   Chief Complaint Chief Complaint  Patient presents with  . Rash    HPI Patricia Armstrong is a 69 y.o. female.   HPI  Patient has 2 rashes She has been seen here for both of these rashes On 11/24/2019 she was seen for rash on her legs.  It was thought to be cellulitis.  She was treated with doxycycline and hydroxyzine. She returned on 11/30/2009.  She had completed her doxycycline.  She had broken out again urticaria on her chest and back.  She was given a prednisone pack. She states that the urticaria improved on the prednisone pack and then came back.  She still has an itchy allergic rash all over her trunk She states that the rash on her legs did not respond to the prednisone or to the doxycycline.  She continues to have patches that are similar to her original visit.  They are pruritic.  History reviewed. No pertinent past medical history.  There are no problems to display for this patient.   Past Surgical History:  Procedure Laterality Date  . ABDOMINAL HYSTERECTOMY    . HIP SURGERY      OB History   No obstetric history on file.      Home Medications    Prior to Admission medications   Medication Sig Start Date End Date Taking? Authorizing Provider  clotrimazole-betamethasone (LOTRISONE) cream Apply to affected area 2 times daily 12/15/19   Eustace Moore, MD  predniSONE (DELTASONE) 20 MG tablet Take one tab 2 x a day for one week then one tab daily for one week 12/15/19   Eustace Moore, MD  diphenhydrAMINE (BENADRYL) 25 MG tablet Take 1 tablet (25 mg total) by mouth every 6 (six) hours. 09/04/17 12/23/18  Georgetta Haber, NP  famotidine (PEPCID) 20 MG tablet Take 1 tablet (20 mg total) by mouth 2 (two) times daily. 09/04/17 12/23/18  Georgetta Haber, NP    Family History Family History  Problem Relation Age of Onset  . Diabetes Mother     Social  History Social History   Tobacco Use  . Smoking status: Never Smoker  . Smokeless tobacco: Never Used  Substance Use Topics  . Alcohol use: No  . Drug use: Not on file     Allergies   Penicillins   Review of Systems Review of Systems  Skin: Positive for rash.     Physical Exam Triage Vital Signs ED Triage Vitals  Enc Vitals Group     BP 12/15/19 0817 106/75     Pulse Rate 12/15/19 0817 70     Resp 12/15/19 0817 14     Temp 12/15/19 0817 97.9 F (36.6 C)     Temp Source 12/15/19 0817 Oral     SpO2 12/15/19 0817 98 %     Weight --      Height --      Head Circumference --      Peak Flow --      Pain Score 12/15/19 0853 0     Pain Loc --      Pain Edu? --      Excl. in GC? --    No data found.  Updated Vital Signs BP 106/75 (BP Location: Right Arm)   Pulse 70   Temp 97.9 F (36.6 C) (Oral)   Resp 14   SpO2 98%  Physical Exam Constitutional:      General: She is not in acute distress.    Appearance: She is well-developed and normal weight.     Comments: Pleasant and cooperative  HENT:     Head: Normocephalic and atraumatic.     Mouth/Throat:     Comments: Mask is in place Eyes:     Conjunctiva/sclera: Conjunctivae normal.     Pupils: Pupils are equal, round, and reactive to light.  Cardiovascular:     Rate and Rhythm: Normal rate.  Pulmonary:     Effort: Pulmonary effort is normal. No respiratory distress.  Musculoskeletal:        General: Normal range of motion.     Cervical back: Normal range of motion.  Skin:    General: Skin is warm and dry.     Comments: Patient has urticaria on her abdomen.  Her entire abdomen looks like 1 urticarial wheals. She also has rash on her leg and arm.  She has an oval irregular rash with a hyperpigmented center and active borders with vesicles and scale.  Concern for tinea  Neurological:     Mental Status: She is alert.  Psychiatric:        Mood and Affect: Mood normal.        Behavior: Behavior normal.       UC Treatments / Results  Labs (all labs ordered are listed, but only abnormal results are displayed) Labs Reviewed - No data to display  EKG   Radiology No results found.  Procedures Procedures (including critical care time)  Medications Ordered in UC Medications  methylPREDNISolone sodium succinate (SOLU-MEDROL) 125 mg/2 mL injection 80 mg (80 mg Intramuscular Given 12/15/19 0846)    Initial Impression / Assessment and Plan / UC Course  I have reviewed the triage vital signs and the nursing notes.  Pertinent labs & imaging results that were available during my care of the patient were reviewed by me and considered in my medical decision making (see chart for details).     Reviewed with patient that she needs a longer course of prednisone for the urticaria.  Take Zyrtec 10 mg to 20 mg a day. Reviewed the appearance of the legs with oval patches looks like it could be fungal.  We will give her Lotrisone for the rash and itch. Patient may need to see dermatology if her rash persists.  Is been there for over a month Reviewed with patient that she needs primary care for follow-up and preventative medical care  Final Clinical Impressions(s) / UC Diagnoses   Final diagnoses:  Urticaria  Allergic reaction, subsequent encounter  Tinea corporis     Discharge Instructions     Take the prednisone as directed Use the lotrisone lotion 2 x a day to all affected areas Continue using until area is completely clear Return if not seeing improvement in one week See primary care for routine follow up   ED Prescriptions    Medication Sig Dispense Auth. Provider   clotrimazole-betamethasone (LOTRISONE) cream Apply to affected area 2 times daily 45 g Raylene Everts, MD   predniSONE (DELTASONE) 20 MG tablet Take one tab 2 x a day for one week then one tab daily for one week 10 tablet Raylene Everts, MD     PDMP not reviewed this encounter.   Raylene Everts,  MD 12/15/19 [phone]

## 2019-12-15 NOTE — ED Triage Notes (Signed)
Patient was here multiple times over the last couple weeks for the same complaint. She is presenting with an itchy rash on her trunk that did not resolve with previous treatment. She also still has a wound on her right leg that has been draining. Denies fever, chills.

## 2020-02-19 ENCOUNTER — Ambulatory Visit (HOSPITAL_COMMUNITY)
Admission: EM | Admit: 2020-02-19 | Discharge: 2020-02-19 | Disposition: A | Discharge status: Home / Self Care | Payer: Medicare Other | Attending: Family Medicine | Admitting: Family Medicine

## 2020-02-19 ENCOUNTER — Other Ambulatory Visit: Payer: Self-pay

## 2020-02-19 ENCOUNTER — Encounter (HOSPITAL_COMMUNITY): Payer: Self-pay

## 2020-02-19 DIAGNOSIS — R634 Abnormal weight loss: Secondary | ICD-10-CM

## 2020-02-19 DIAGNOSIS — F4321 Adjustment disorder with depressed mood: Secondary | ICD-10-CM

## 2020-02-19 DIAGNOSIS — F4381 Prolonged grief disorder: Secondary | ICD-10-CM

## 2020-02-19 DIAGNOSIS — F4329 Adjustment disorder with other symptoms: Secondary | ICD-10-CM

## 2020-02-19 MED ORDER — ALPRAZOLAM 0.25 MG PO TABS: 0.2500 mg | ORAL_TABLET | Freq: Every evening | ORAL | 0 refills | Status: AC | PRN

## 2020-02-19 NOTE — ED Provider Notes (Signed)
[facility]    CSN: [id number] Arrival date & time: 02/19/20  1002      History   Chief Complaint Chief Complaint  Patient presents with   depression    HPI Patricia Armstrong is a 69 y.o. female.   HPI Very pleasant 69 year old woman.  Return visit here to the urgent care center.  Does not currently have a primary care doctor.  Does not have any known ongoing medical problems, does not take prescription medication. She is here today for depression.  She states that she has been grieving ever since the loss of her significant other in April 2020.  Its been over 15 months.  She states that she still has periods of extreme sadness.  Some days she cannot go to work.  She has been eating very little.  Not socializing.  Not sleeping well.  Her weight is gone from 134 to 116 pounds.  She is often tearful.  Never thinking of harming herself or others.  Has never been treated for depression or anxiety.  She states that she came here today "this is my first step towards getting help. We discussed her support systems, children, friends, siblings We discussed exercise We talked about supplementing her diet with Ensure or boost until her appetite comes back I talked to her about depression.  SSRI medications.  Counseling.  I talked to her about grief.  That it is normal to grieve for 2 years or more after the loss of a spouse, but that she, in my opinion, is grieving excessively.  Should progressively be getting better, and she is not.  History reviewed. No pertinent past medical history.  There are no problems to display for this patient.   Past Surgical History:  Procedure Laterality Date   ABDOMINAL HYSTERECTOMY     HIP SURGERY      OB History   No obstetric history on file.      Home Medications    Prior to Admission medications   Medication Sig Start Date End Date Taking? Authorizing Provider  ALPRAZolam (XANAX) 0.25 MG tablet Take 1 tablet (0.25 mg total) by  mouth at bedtime as needed for anxiety. 02/19/20   Eustace Moore, MD  diphenhydrAMINE (BENADRYL) 25 MG tablet Take 1 tablet (25 mg total) by mouth every 6 (six) hours. 09/04/17 12/23/18  Georgetta Haber, NP  famotidine (PEPCID) 20 MG tablet Take 1 tablet (20 mg total) by mouth 2 (two) times daily. 09/04/17 12/23/18  Georgetta Haber, NP    Family History Family History  Problem Relation Age of Onset   Diabetes Mother     Social History Social History   Tobacco Use   Smoking status: Never Smoker   Smokeless tobacco: Never Used  Building services engineer Use: Never used  Substance Use Topics   Alcohol use: Yes    Comment: occasional   Drug use: Never     Allergies   Penicillin g   Review of Systems Review of Systems See HPI  Physical Exam Triage Vital Signs ED Triage Vitals  Enc Vitals Group     BP 02/19/20 1140 (!) 163/94     Pulse Rate 02/19/20 1140 (!) 103     Resp 02/19/20 1140 18     Temp 02/19/20 1140 98.3 F (36.8 C)     Temp Source 02/19/20 1140 Oral     SpO2 02/19/20 1140 98 %     Weight 02/19/20 1222 116 lb (52.6 kg)  Height --      Head Circumference --      Peak Flow --      Pain Score 02/19/20 1141 0     Pain Loc --      Pain Edu? --      Excl. in GC? --    No data found.  Updated Vital Signs BP (!) 163/94 (BP Location: Right Arm)    Pulse (!) 103    Temp 98.3 F (36.8 C) (Oral)    Resp 18    Wt 52.6 kg    SpO2 98%    BMI 19.30 kg/m      Physical Exam Constitutional:      General: She is not in acute distress.    Appearance: She is well-developed.     Comments: Patient appears thin and frail  HENT:     Head: Normocephalic and atraumatic.     Mouth/Throat:     Comments: Mask in place Eyes:     Conjunctiva/sclera: Conjunctivae normal.     Pupils: Pupils are equal, round, and reactive to light.  Cardiovascular:     Rate and Rhythm: Normal rate.  Pulmonary:     Effort: Pulmonary effort is normal. No respiratory distress.    Musculoskeletal:        General: Normal range of motion.     Cervical back: Normal range of motion.  Skin:    General: Skin is warm and dry.  Neurological:     Mental Status: She is alert.  Psychiatric:        Attention and Perception: Attention normal.        Mood and Affect: Mood is anxious and depressed. Affect is tearful.        Speech: Speech normal.        Behavior: Behavior normal.        Thought Content: Thought content normal.        Cognition and Memory: Cognition normal.        Judgment: Judgment normal.     Comments: Language is fluent.  Thoughts are clear.      UC Treatments / Results  Labs (all labs ordered are listed, but only abnormal results are displayed) Labs Reviewed - No data to display  EKG   Radiology No results found.  Procedures Procedures (including critical care time)  Medications Ordered in UC Medications - No data to display  Initial Impression / Assessment and Plan / UC Course  I have reviewed the triage vital signs and the nursing notes.  Pertinent labs & imaging results that were available during my care of the patient were reviewed by me and considered in my medical decision making (see chart for details).     Referred for counseling Offered medication, declined Note for work I did give the patient a limited number of Xanax 0.25.  This is to take at bedtime.  Also during the day if she was absolutely overwhelmed.  She states she does not like to take medication.  I told her she could dispose of it if it does not work for her, or she does not like the way it makes her feel.  I do think it may help her cope with the days are so overwhelming. Final Clinical Impressions(s) / UC Diagnoses   Final diagnoses:  Grief reaction with prolonged bereavement  Loss of weight     Discharge Instructions     I recommend grief counseling through Hospice  .  [phone] I have  included a list of other counseling centers for you Call [facility]  [facility] for a PCP appointment Try to rest.  Take a walk outdoors daily.  Supplement diet with ensure or boost. Take alprazolam as needed     ED Prescriptions    Medication Sig Dispense Auth. Provider   ALPRAZolam (XANAX) 0.25 MG tablet Take 1 tablet (0.25 mg total) by mouth at bedtime as needed for anxiety. 30 tablet Eustace Moore, MD     I have reviewed the PDMP during this encounter.   Eustace Moore, MD 02/19/20 [phone]

## 2020-02-19 NOTE — ED Notes (Signed)
Pt states she weighed 130 lbs last year and 119lbs "a couple weeks ago".

## 2020-02-19 NOTE — Discharge Instructions (Addendum)
I recommend grief counseling through Hospice  .  [phone] I have included a list of other counseling centers for you Call [facility] for a PCP appointment Try to rest.  Take a walk outdoors daily.  Supplement diet with ensure or boost. Take alprazolam as needed

## 2020-02-19 NOTE — ED Triage Notes (Signed)
Pt c/o deep sadness since her significant other passed away last year. Reports that sadness, loss of appetite, loss of weight, lack of motivation to engage socially has increased significantly the past several months. Pt tearful. Denies suicidal ideations, harm to others. Emotional support provided.

## 2020-02-26 ENCOUNTER — Ambulatory Visit (HOSPITAL_COMMUNITY)
Admission: EM | Admit: 2020-02-26 | Discharge: 2020-02-26 | Disposition: A | Discharge status: Home / Self Care | Payer: Medicare Other | Attending: Emergency Medicine | Admitting: Emergency Medicine

## 2020-02-26 ENCOUNTER — Encounter (HOSPITAL_COMMUNITY): Payer: Self-pay

## 2020-02-26 ENCOUNTER — Other Ambulatory Visit: Payer: Self-pay

## 2020-02-26 DIAGNOSIS — R21 Rash and other nonspecific skin eruption: Secondary | ICD-10-CM

## 2020-02-26 MED ORDER — HYDROXYZINE HCL 25 MG PO TABS: 12.5000 mg | ORAL_TABLET | Freq: Three times a day (TID) | ORAL | 0 refills | Status: DC | PRN

## 2020-02-26 MED ORDER — BETAMETHASONE VALERATE 0.1 % EX OINT: 1.0000 "application " | TOPICAL_OINTMENT | Freq: Two times a day (BID) | CUTANEOUS | 0 refills | Status: AC

## 2020-02-26 NOTE — Discharge Instructions (Signed)
You may use the ointment I have prescribed to the lesions you are able to reach.  Cool showers, heat can trigger itching.  Hydroxyzine as needed for itching, may cause drowsiness.  Please follow up with primary care and/or dermatology if rash persists.

## 2020-02-26 NOTE — ED Triage Notes (Signed)
Pt here with concerns about "bumps" all over body x 2 months. Benadryl at home for itching but states "they keep popping up"

## 2020-02-26 NOTE — ED Provider Notes (Signed)
[facility]    CSN: [id number] Arrival date & time: 02/26/20  6659      History   Chief Complaint Chief Complaint  Patient presents with   Rash    HPI Patricia Armstrong is a 69 y.o. female.   Patricia Armstrong presents with complaints of rash. This has been persistent for months now. She has been seen in the past and given prednisone/steroids, as well as antibiotics and topical creams in the past. These have briefly helped but rash doesn't completely resolve. No known exposures. No pain. Itches. No drainage or pustules. No pets in the home. Doesn't spend time outside. No others live in the home. Prior to the initial onset no previous similar rash. She does not follow with a PCP.   ROS per HPI, negative if not otherwise mentioned.      History reviewed. No pertinent past medical history.  There are no problems to display for this patient.   Past Surgical History:  Procedure Laterality Date   ABDOMINAL HYSTERECTOMY     HIP SURGERY      OB History   No obstetric history on file.      Home Medications    Prior to Admission medications   Medication Sig Start Date End Date Taking? Authorizing Provider  ALPRAZolam (XANAX) 0.25 MG tablet Take 1 tablet (0.25 mg total) by mouth at bedtime as needed for anxiety. 02/19/20   Eustace Moore, MD  betamethasone valerate ointment (VALISONE) 0.1 % Apply 1 application topically 2 (two) times daily. 02/26/20   Georgetta Haber, NP  hydrOXYzine (ATARAX/VISTARIL) 25 MG tablet Take 0.5-1 tablets (12.5-25 mg total) by mouth every 8 (eight) hours as needed for itching. 02/26/20   Georgetta Haber, NP  diphenhydrAMINE (BENADRYL) 25 MG tablet Take 1 tablet (25 mg total) by mouth every 6 (six) hours. 09/04/17 12/23/18  Georgetta Haber, NP  famotidine (PEPCID) 20 MG tablet Take 1 tablet (20 mg total) by mouth 2 (two) times daily. 09/04/17 12/23/18  Georgetta Haber, NP    Family History Family History  Problem Relation Age  of Onset   Diabetes Mother     Social History Social History   Tobacco Use   Smoking status: Never Smoker   Smokeless tobacco: Never Used  Building services engineer Use: Never used  Substance Use Topics   Alcohol use: Yes    Comment: occasional   Drug use: Never     Allergies   Penicillin g   Review of Systems Review of Systems   Physical Exam Triage Vital Signs ED Triage Vitals  Enc Vitals Group     BP 02/26/20 0829 115/80     Pulse Rate 02/26/20 0829 90     Resp 02/26/20 0829 16     Temp 02/26/20 0829 98.2 F (36.8 C)     Temp src --      SpO2 02/26/20 0829 98 %     Weight --      Height --      Head Circumference --      Peak Flow --      Pain Score 02/26/20 0910 0     Pain Loc --      Pain Edu? --      Excl. in GC? --    No data found.  Updated Vital Signs BP 115/80    Pulse 90    Temp 98.2 F (36.8 C)    Resp 16    SpO2  98%   Visual Acuity Right Eye Distance:   Left Eye Distance:   Bilateral Distance:    Right Eye Near:   Left Eye Near:    Bilateral Near:     Physical Exam Constitutional:      General: She is not in acute distress.    Appearance: She is well-developed.  Cardiovascular:     Rate and Rhythm: Normal rate.  Pulmonary:     Effort: Pulmonary effort is normal.  Skin:    General: Skin is warm and dry.     Findings: Rash present. Rash is papular.     Comments: Scattered papular rash to legs, arms, trunk; spares hands, face, axilla, feet; non tender; no surrounding redness; no drainage; some with scabbing from scratching  Neurological:     Mental Status: She is alert and oriented to person, place, and time.      UC Treatments / Results  Labs (all labs ordered are listed, but only abnormal results are displayed) Labs Reviewed - No data to display  EKG   Radiology No results found.  Procedures Procedures (including critical care time)  Medications Ordered in UC Medications - No data to display  Initial  Impression / Assessment and Plan / UC Course  I have reviewed the triage vital signs and the nursing notes.  Pertinent labs & imaging results that were available during my care of the patient were reviewed by me and considered in my medical decision making (see chart for details).     Papular rash. Bug bites considered, patient insistent that she has had no bug exposures. No indication of cellulitis at this time. Steroid cream provided for prn use, vistaril for itching. Encouraged follow up with pcp and/or dermatology. Return precautions provided. Patient verbalized understanding and agreeable to plan.   Final Clinical Impressions(s) / UC Diagnoses   Final diagnoses:  Rash and nonspecific skin eruption     Discharge Instructions     You may use the ointment I have prescribed to the lesions you are able to reach.  Cool showers, heat can trigger itching.  Hydroxyzine as needed for itching, may cause drowsiness.  Please follow up with primary care and/or dermatology if rash persists.    ED Prescriptions    Medication Sig Dispense Auth. Provider   betamethasone valerate ointment (VALISONE) 0.1 % Apply 1 application topically 2 (two) times daily. 30 g Linus Mako B, NP   hydrOXYzine (ATARAX/VISTARIL) 25 MG tablet Take 0.5-1 tablets (12.5-25 mg total) by mouth every 8 (eight) hours as needed for itching. 12 tablet Georgetta Haber, NP     PDMP not reviewed this encounter.   Georgetta Haber, NP 02/26/20 [phone]

## 2020-03-09 ENCOUNTER — Other Ambulatory Visit: Payer: Self-pay

## 2020-03-09 ENCOUNTER — Ambulatory Visit (HOSPITAL_COMMUNITY)
Admission: EM | Admit: 2020-03-09 | Discharge: 2020-03-09 | Disposition: A | Discharge status: Home / Self Care | Payer: Medicare Other | Attending: Emergency Medicine | Admitting: Emergency Medicine

## 2020-03-09 ENCOUNTER — Encounter (HOSPITAL_COMMUNITY): Payer: Self-pay

## 2020-03-09 DIAGNOSIS — R21 Rash and other nonspecific skin eruption: Secondary | ICD-10-CM | POA: Diagnosis not present

## 2020-03-09 MED ORDER — HYDROXYZINE HCL 25 MG PO TABS: 12.5000 mg | ORAL_TABLET | Freq: Three times a day (TID) | ORAL | 0 refills | Status: DC | PRN

## 2020-03-09 NOTE — Discharge Instructions (Signed)
Take the hydroxyzine as directed as needed for itching.    Keep your scheduled appointment with dermatology on September 14 as planned.

## 2020-03-09 NOTE — ED Provider Notes (Signed)
[facility]    CSN: [id number] Arrival date & time: 03/09/20  1010      History   Chief Complaint Chief Complaint  Patient presents with  . Rash    HPI Patricia Armstrong is a 69 y.o. female.   Patient presents with ongoing pruritic rash x several months for which she has been seen several times.  She states the rash is pruritic; improves at times but then returns; never completely clears.  Patient was seen on 02/26/2020; diagnosed with a rash; treated with betamethasone ointment and hydroxyzine.  She was seen here on 12/15/2019; diagnosed with urticaria, allergic reaction, tinea corporis; treated with prednisone, Zyrtec, Lotrisone.  She was seen here on 12/01/2019; diagnosed with allergic urticaria; treated with prednisone and hydroxyzine.  Patient has been instructed at each visit to follow-up with dermatology which she has not done yet but she has an appointment on September 14.  Patient denies fever, chills, chest pain, shortness of breath, abdominal pain, or other symptoms.  The history is provided by the patient.    History reviewed. No pertinent past medical history.  There are no problems to display for this patient.   Past Surgical History:  Procedure Laterality Date  . ABDOMINAL HYSTERECTOMY    . HIP SURGERY      OB History   No obstetric history on file.      Home Medications    Prior to Admission medications   Medication Sig Start Date End Date Taking? Authorizing Provider  ALPRAZolam (XANAX) 0.25 MG tablet Take 1 tablet (0.25 mg total) by mouth at bedtime as needed for anxiety. 02/19/20   Eustace Moore, MD  betamethasone valerate ointment (VALISONE) 0.1 % Apply 1 application topically 2 (two) times daily. 02/26/20   Georgetta Haber, NP  hydrOXYzine (ATARAX/VISTARIL) 25 MG tablet Take 0.5-1 tablets (12.5-25 mg total) by mouth every 8 (eight) hours as needed for itching. 03/09/20   Mickie Bail, NP  diphenhydrAMINE (BENADRYL) 25 MG tablet Take 1  tablet (25 mg total) by mouth every 6 (six) hours. 09/04/17 12/23/18  Georgetta Haber, NP  famotidine (PEPCID) 20 MG tablet Take 1 tablet (20 mg total) by mouth 2 (two) times daily. 09/04/17 12/23/18  Georgetta Haber, NP    Family History Family History  Problem Relation Age of Onset  . Diabetes Mother     Social History Social History   Tobacco Use  . Smoking status: Never Smoker  . Smokeless tobacco: Never Used  Vaping Use  . Vaping Use: Never used  Substance Use Topics  . Alcohol use: Yes    Comment: occasional  . Drug use: Never     Allergies   Penicillin g   Review of Systems Review of Systems  Constitutional: Negative for chills and fever.  HENT: Negative for ear pain and sore throat.   Eyes: Negative for pain and visual disturbance.  Respiratory: Negative for cough and shortness of breath.   Cardiovascular: Negative for chest pain and palpitations.  Gastrointestinal: Negative for abdominal pain and vomiting.  Genitourinary: Negative for dysuria and hematuria.  Musculoskeletal: Negative for arthralgias and back pain.  Skin: Positive for rash. Negative for color change.  Neurological: Negative for seizures and syncope.  All other systems reviewed and are negative.    Physical Exam Triage Vital Signs ED Triage Vitals  Enc Vitals Group     BP 03/09/20 1048 122/80     Pulse Rate 03/09/20 1048 79     Resp  03/09/20 1048 18     Temp 03/09/20 1048 98.9 F (37.2 C)     Temp Source 03/09/20 1048 Oral     SpO2 03/09/20 1048 100 %     Weight 03/09/20 1051 113 lb (51.3 kg)     Height 03/09/20 1051 5' 4.5" (1.638 m)     Head Circumference --      Peak Flow --      Pain Score 03/09/20 1051 0     Pain Loc --      Pain Edu? --      Excl. in GC? --    No data found.  Updated Vital Signs BP 122/80   Pulse 79   Temp 98.9 F (37.2 C) (Oral)   Resp 18   Ht 5' 4.5" (1.638 m)   Wt 113 lb (51.3 kg)   SpO2 100%   BMI 19.10 kg/m   Visual Acuity Right Eye  Distance:   Left Eye Distance:   Bilateral Distance:    Right Eye Near:   Left Eye Near:    Bilateral Near:     Physical Exam Vitals and nursing note reviewed.  Constitutional:      General: She is not in acute distress.    Appearance: She is well-developed. She is not ill-appearing.  HENT:     Head: Normocephalic and atraumatic.     Mouth/Throat:     Mouth: Mucous membranes are moist.  Eyes:     Conjunctiva/sclera: Conjunctivae normal.  Cardiovascular:     Rate and Rhythm: Normal rate and regular rhythm.     Heart sounds: No murmur heard.   Pulmonary:     Effort: Pulmonary effort is normal. No respiratory distress.     Breath sounds: Normal breath sounds.  Abdominal:     Palpations: Abdomen is soft.     Tenderness: There is no abdominal tenderness.  Musculoskeletal:     Cervical back: Neck supple.  Skin:    General: Skin is warm and dry.     Findings: Rash present.     Comments: Scattered red papular rash on trunk and extremities.  Neurological:     Mental Status: She is alert.     Gait: Gait normal.  Psychiatric:        Mood and Affect: Mood normal.        Behavior: Behavior normal.      UC Treatments / Results  Labs (all labs ordered are listed, but only abnormal results are displayed) Labs Reviewed - No data to display  EKG   Radiology No results found.  Procedures Procedures (including critical care time)  Medications Ordered in UC Medications - No data to display  Initial Impression / Assessment and Plan / UC Course  I have reviewed the triage vital signs and the nursing notes.  Pertinent labs & imaging results that were available during my care of the patient were reviewed by me and considered in my medical decision making (see chart for details).   Rash.  Hydroxyzine as needed for itching.  Precautions for drowsiness with this medication discussed.  Instructed patient to keep her appointment as scheduled with dermatology on September 14.   Patient agrees to plan of care.   Final Clinical Impressions(s) / UC Diagnoses   Final diagnoses:  Rash     Discharge Instructions     Take the hydroxyzine as directed as needed for itching.    Keep your scheduled appointment with dermatology on September 14 as planned.  ED Prescriptions    Medication Sig Dispense Auth. Provider   hydrOXYzine (ATARAX/VISTARIL) 25 MG tablet Take 0.5-1 tablets (12.5-25 mg total) by mouth every 8 (eight) hours as needed for itching. 12 tablet Mickie Bail, NP     PDMP not reviewed this encounter.   Mickie Bail, NP 03/09/20 1118

## 2020-03-09 NOTE — ED Triage Notes (Signed)
Pt c/o rash on arms bilat, legs bilat, abdomen, backx1 mo. Pt states was here a few wks ago and was given medicine and completed the meds and rash is still there. Pt states it itches. PT has raised bumps on all of those places.

## 2020-03-26 ENCOUNTER — Ambulatory Visit (INDEPENDENT_AMBULATORY_CARE_PROVIDER_SITE_OTHER): Payer: Medicare Other | Admitting: Family Medicine

## 2020-03-26 ENCOUNTER — Encounter: Payer: Self-pay | Admitting: Family Medicine

## 2020-03-26 VITALS — BP 120/70 | HR 86 | Ht 66.5 in | Wt 119.6 lb

## 2020-03-26 DIAGNOSIS — R5383 Other fatigue: Secondary | ICD-10-CM

## 2020-03-26 DIAGNOSIS — L298 Other pruritus: Secondary | ICD-10-CM

## 2020-03-26 DIAGNOSIS — R232 Flushing: Secondary | ICD-10-CM

## 2020-03-26 DIAGNOSIS — L2989 Other pruritus: Secondary | ICD-10-CM

## 2020-03-26 DIAGNOSIS — R7989 Other specified abnormal findings of blood chemistry: Secondary | ICD-10-CM | POA: Diagnosis not present

## 2020-03-26 DIAGNOSIS — R945 Abnormal results of liver function studies: Secondary | ICD-10-CM | POA: Diagnosis not present

## 2020-03-26 DIAGNOSIS — E559 Vitamin D deficiency, unspecified: Secondary | ICD-10-CM | POA: Diagnosis not present

## 2020-03-26 DIAGNOSIS — F329 Major depressive disorder, single episode, unspecified: Secondary | ICD-10-CM | POA: Diagnosis not present

## 2020-03-26 DIAGNOSIS — F32A Depression, unspecified: Secondary | ICD-10-CM

## 2020-03-26 MED ORDER — ESCITALOPRAM OXALATE 10 MG PO TABS: 10.0000 mg | ORAL_TABLET | Freq: Every day | ORAL | 2 refills | Status: AC

## 2020-03-26 NOTE — Progress Notes (Signed)
Subjective:    Patient ID: Patricia Armstrong, female    DOB: March 22, 1951, 69 y.o.   MRN: [id number]  HPI Chief Complaint  Patient presents with  . new pt    new pt get established. has breakouts all over body that come and go- been put on meds for this before, significant other past away last year and been depressed no appetite , has had a spoke to conselor over the phone before for this   She is here to establish care.  Previous medical care: urgent care No PCP in years.   Complains of an intermittent pruritic rash for the past 5 months. She has been seen several times at [facility] for this. She continues having new spots appear on her arms, legs and torso. Unclear etiology. She has been treated with an antibiotic in the past as well as steroids and hydroxyzine.   Reports having night sweats and hot flashes for several years.  She also has fatigue and little motivation.   States her significant other passed away on 12-06-18. States she has not really dealt with his death. She did have a virtual counseling appt recently and plans to have another.   Complains of depression related to not dealing with the death of her loved one.  She also thinks she may have anxiety.  Has a prescription bottle with alprazolam but has not been taking them. States she does not like to take medication.  Denies self medication with alcohol or drugs. Does not smoke.  No SI or HI.   States she thinks she needs medication for her mood. Denies ever taking anything.   Depression screen PHQ 2/9 03/26/2020  Decreased Interest 2  Down, Depressed, Hopeless 2  PHQ - 2 Score 4  Altered sleeping 3  Tired, decreased energy 3  Change in appetite 3  Feeling bad or failure about yourself  0  Trouble concentrating 0  Moving slowly or fidgety/restless 1  Suicidal thoughts 0  PHQ-9 Score 14  Difficult doing work/chores Not difficult at all    Works as a Office manager at [employer].  3 adult children. They do not  live here. Her son is in [place] and she is going to stay with him starting this weekend for a couple of months.     Review of Systems Pertinent positives and negatives in the history of present illness.     Objective:   Physical Exam BP 120/70   Pulse 86   Ht 5' 6.5" (1.689 m)   Wt 119 lb 9.6 oz (54.3 kg)   BMI 19.01 kg/m   Alert and in no distress.  Cardiac exam shows a regular sinus rhythm without murmurs or gallops. Lungs are clear to auscultation. Skin is warm and dry. Pruritic rash with discreet maculopapules on her arms and lower legs. No sign of infection.        Assessment & Plan:  Depression, unspecified depression type - Plan: TSH, T4, free, T3, escitalopram (LEXAPRO) 10 MG tablet -no thoughts of self harm and is not self medicating. She will continue with counseling and start on Lexapro, 1/2 tablet for the first week. Discussed potential side effects.  Follow up with me in 2 weeks virtually since she will be in [place] with her son.   Hot flashes - Plan: TSH, T4, free, T3 -check labs and follow up. Continue Black Cohosh  Chronic pruritic rash in adult -unclear etiology. Ongoing for the past 5 months. She will start on an  oral non drowsy antihistamine such as Allegra. Discussed options of dermatology referral vs allergist. She is going to stay with her son for the next 2 months in [place]. Follow up if not improving.   Fatigue, unspecified type - Plan: CBC with Differential/Platelet, Comprehensive metabolic panel, TSH, T4, free, T3, VITAMIN D 25 Hydroxy (Vit-D Deficiency, Fractures), Iron, TIBC and Ferritin Panel -discussed potential etiologies for fatigue. Depression may be playing a role but rule out other reasons with labs.   Vitamin D deficiency - Plan: VITAMIN D 25 Hydroxy (Vit-D Deficiency, Fractures) -check vitamin D level

## 2020-03-26 NOTE — Patient Instructions (Addendum)
Start on 1/2 tablet of the Lexapro for the first week.  As long as you are tolerating it well, you can increase to the full tablet the second week. If you have any worrisome symptoms in the, stop the medication and let me know.  Continue seeing a therapist  Follow-up with me virtually in 2 weeks  Start taking Allegra daily for your rash.  If it is getting worse or not improving then we may need to refer you to a dermatologist or an allergist.  You may take Benadryl at night if needed for worsening itching  I would like for you to return for a complete physical exam and Medicare wellness visit in 3 months

## 2020-03-27 LAB — CBC WITH DIFFERENTIAL/PLATELET
Basophils Absolute: 0 10*3/uL (ref 0.0–0.2)
Basos: 1 %
EOS (ABSOLUTE): 0 10*3/uL (ref 0.0–0.4)
Eos: 1 %
Hematocrit: 37.4 % (ref 34.0–46.6)
Hemoglobin: 13 g/dL (ref 11.1–15.9)
Immature Grans (Abs): 0 10*3/uL (ref 0.0–0.1)
Immature Granulocytes: 0 %
Lymphocytes Absolute: 1.3 10*3/uL (ref 0.7–3.1)
Lymphs: 37 %
MCH: 32.7 pg (ref 26.6–33.0)
MCHC: 34.8 g/dL (ref 31.5–35.7)
MCV: 94 fL (ref 79–97)
Monocytes Absolute: 0.5 10*3/uL (ref 0.1–0.9)
Monocytes: 15 %
Neutrophils Absolute: 1.6 10*3/uL (ref 1.4–7.0)
Neutrophils: 46 %
Platelets: 299 10*3/uL (ref 150–450)
RBC: 3.98 x10E6/uL (ref 3.77–5.28)
RDW: 12.7 % (ref 11.7–15.4)
WBC: 3.6 10*3/uL (ref 3.4–10.8)

## 2020-03-27 LAB — COMPREHENSIVE METABOLIC PANEL
ALT: 34 IU/L — ABNORMAL HIGH (ref 0–32)
AST: 75 IU/L — ABNORMAL HIGH (ref 0–40)
Albumin/Globulin Ratio: 1.4 (ref 1.2–2.2)
Albumin: 4 g/dL (ref 3.8–4.8)
Alkaline Phosphatase: 112 IU/L (ref 44–121)
BUN/Creatinine Ratio: 8 — ABNORMAL LOW (ref 12–28)
BUN: 6 mg/dL — ABNORMAL LOW (ref 8–27)
Bilirubin Total: 0.5 mg/dL (ref 0.0–1.2)
CO2: 20 mmol/L (ref 20–29)
Calcium: 9.3 mg/dL (ref 8.7–10.3)
Chloride: 104 mmol/L (ref 96–106)
Creatinine, Ser: 0.76 mg/dL (ref 0.57–1.00)
GFR calc Af Amer: 94 mL/min/{1.73_m2} (ref >59)
GFR calc non Af Amer: 81 mL/min/{1.73_m2} (ref >59)
Globulin, Total: 2.9 g/dL (ref 1.5–4.5)
Glucose: 91 mg/dL (ref 65–99)
Potassium: 4.1 mmol/L (ref 3.5–5.2)
Sodium: 141 mmol/L (ref 134–144)
Total Protein: 6.9 g/dL (ref 6.0–8.5)

## 2020-03-27 LAB — TSH: TSH: 0.555 u[IU]/mL (ref 0.450–4.500)

## 2020-03-27 LAB — IRON,TIBC AND FERRITIN PANEL
Ferritin: 56 ng/mL (ref 15–150)
Iron Saturation: 41 % (ref 15–55)
Iron: 111 ug/dL (ref 27–139)
Total Iron Binding Capacity: 272 ug/dL (ref 250–450)
UIBC: 161 ug/dL (ref 118–369)

## 2020-03-27 LAB — T4, FREE: Free T4: 0.69 ng/dL — ABNORMAL LOW (ref 0.82–1.77)

## 2020-03-27 LAB — T3: T3, Total: 110 ng/dL (ref 71–180)

## 2020-03-27 LAB — VITAMIN D 25 HYDROXY (VIT D DEFICIENCY, FRACTURES): Vit D, 25-Hydroxy: 30.8 ng/mL (ref 30.0–100.0)

## 2020-03-27 NOTE — Progress Notes (Signed)
Please ask Maria to add an acute hepatitis panel due to elevated liver enzymes.

## 2020-03-28 LAB — SPECIMEN STATUS REPORT

## 2020-03-28 LAB — HEPATITIS PANEL, ACUTE
Hep A IgM: NEGATIVE
Hep B C IgM: NEGATIVE
Hep C Virus Ab: <0.1 s/co ratio (ref 0.0–0.9)
Hepatitis B Surface Ag: NEGATIVE

## 2020-03-31 NOTE — Progress Notes (Signed)
Her labs show elevated liver enzymes. She is negative for acute hepatitis. No sign of anemia or low iron. Normal vitamin D level. Her liver function needs to be rechecked in the next 2-3 weeks. This can be done here or wherever she is. I think she is staying with her son in [place] for a few weeks. Avoid alcohol and NSAIDs such as Advil, Aleve, Motrin, ibuprofen, and aspirin to help her liver. Itching can be related to liver disease as well so we need to keep an  eye on this. She may need a liver ultrasound if her liver tests do not improve.

## 2020-04-09 NOTE — Progress Notes (Signed)
° °  Subjective:  Documentation for virtual audio and video telecommunications through [facility] encounter:  The patient was located at her son's house in [place]. 2 patient identifiers used.  The provider was located in the office. The patient did consent to this visit and is aware of possible charges through their insurance for this visit.  The other persons participating in this telemedicine service were none. Time spent on call was 12 minutes and in review of previous records 15 minutes total.  This virtual service is not related to other E/M service within previous 7 days.   Patient ID: Patricia Armstrong, female    DOB: 28-May-1951, 68 y.o.   MRN: [id number]  HPI Chief Complaint  Patient presents with   follow-up anxiety    follow-up anxiety. doing well, no issues   This is a 2 week virtual visit since she is in [place] with her son.  She is following up on depression. She started on Lexapro and is now taking the full tablet.  States she thinks it may be working but is not sure.  Denies any side effects or worsening anxiety or depression.  She would like to stay on the medication.  She plans on staying with her son in [place] through the holidays and returning back to her home in [place] in January.  Her liver enzymes were also elevated at her previous visit. Negative acute hepatitis panel.  We mailed a prescription for her to find a [facility] near her in [place] and have her liver function test repeated. She has not taking any NSAIDs since our last visit and occasional alcohol use.  Chronic pruritic rash. Itching has improved but she still has bumps pop up from time to time. Using Benadryl topical. Taking Allegra.   Denies any new symptoms.  No fever, chills, nausea, vomiting, diarrhea.   Review of Systems Pertinent positives and negatives in the history of present illness.     Objective:   Physical Exam Wt 116 lb (52.6 kg)    BMI 18.44 kg/m  Alert and oriented and  in no acute distress.  Respirations unlabored.  Normal speech, mood and thought process.       Assessment & Plan:  Depression, unspecified depression type  Chronic pruritic rash in adult  Elevated liver enzymes  Virtual visit was done today since she is staying with her son in [place] through the holidays.  Her depression seems to be slightly improved with Lexapro.  Denies any side effects and would like to continue on the medication.  I will make sure she does not run out of her medications until she follows up with me in January. She will find a [facility] near her and get a CMP to follow-up on elevated liver function test.  She has the prescription which we mailed to her.  Counseling on avoiding alcohol and NSAIDs. Itching slightly better.  Continue Allegra.  If she is not back to baseline or if worsening, we will consider referral to dermatology versus allergist when she returns.

## 2020-04-10 ENCOUNTER — Telehealth (INDEPENDENT_AMBULATORY_CARE_PROVIDER_SITE_OTHER): Payer: Medicare Other | Admitting: Family Medicine

## 2020-04-10 ENCOUNTER — Other Ambulatory Visit: Payer: Self-pay

## 2020-04-10 VITALS — Wt 116.0 lb

## 2020-04-10 DIAGNOSIS — F329 Major depressive disorder, single episode, unspecified: Secondary | ICD-10-CM

## 2020-04-10 DIAGNOSIS — L298 Other pruritus: Secondary | ICD-10-CM

## 2020-04-10 DIAGNOSIS — R748 Abnormal levels of other serum enzymes: Secondary | ICD-10-CM

## 2020-04-10 DIAGNOSIS — L2989 Other pruritus: Secondary | ICD-10-CM

## 2020-04-10 DIAGNOSIS — F32A Depression, unspecified: Secondary | ICD-10-CM

## 2020-05-17 DIAGNOSIS — R21 Rash and other nonspecific skin eruption: Secondary | ICD-10-CM | POA: Diagnosis not present

## 2020-06-04 DIAGNOSIS — F32A Depression, unspecified: Secondary | ICD-10-CM | POA: Diagnosis not present

## 2020-06-04 DIAGNOSIS — F419 Anxiety disorder, unspecified: Secondary | ICD-10-CM | POA: Diagnosis not present

## 2020-06-10 DIAGNOSIS — R7989 Other specified abnormal findings of blood chemistry: Secondary | ICD-10-CM | POA: Diagnosis not present

## 2020-06-10 DIAGNOSIS — Z131 Encounter for screening for diabetes mellitus: Secondary | ICD-10-CM | POA: Diagnosis not present

## 2020-06-10 DIAGNOSIS — E7800 Pure hypercholesterolemia, unspecified: Secondary | ICD-10-CM | POA: Diagnosis not present

## 2020-06-10 DIAGNOSIS — R5383 Other fatigue: Secondary | ICD-10-CM | POA: Diagnosis not present

## 2020-06-10 DIAGNOSIS — Z13220 Encounter for screening for lipoid disorders: Secondary | ICD-10-CM | POA: Diagnosis not present

## 2020-06-11 DIAGNOSIS — Z23 Encounter for immunization: Secondary | ICD-10-CM | POA: Diagnosis not present

## 2020-06-18 DIAGNOSIS — L2084 Intrinsic (allergic) eczema: Secondary | ICD-10-CM | POA: Diagnosis not present

## 2020-06-18 DIAGNOSIS — L308 Other specified dermatitis: Secondary | ICD-10-CM | POA: Diagnosis not present

## 2020-06-18 DIAGNOSIS — L309 Dermatitis, unspecified: Secondary | ICD-10-CM | POA: Diagnosis not present

## 2020-06-19 DIAGNOSIS — R945 Abnormal results of liver function studies: Secondary | ICD-10-CM | POA: Diagnosis not present

## 2020-06-19 DIAGNOSIS — D500 Iron deficiency anemia secondary to blood loss (chronic): Secondary | ICD-10-CM | POA: Diagnosis not present

## 2020-06-19 DIAGNOSIS — Z1211 Encounter for screening for malignant neoplasm of colon: Secondary | ICD-10-CM | POA: Diagnosis not present

## 2020-06-25 DIAGNOSIS — L2084 Intrinsic (allergic) eczema: Secondary | ICD-10-CM | POA: Diagnosis not present

## 2020-06-25 DIAGNOSIS — Z4802 Encounter for removal of sutures: Secondary | ICD-10-CM | POA: Diagnosis not present

## 2020-06-27 ENCOUNTER — Ambulatory Visit: Payer: Medicare Other | Admitting: Family Medicine

## 2020-06-27 DIAGNOSIS — R945 Abnormal results of liver function studies: Secondary | ICD-10-CM | POA: Diagnosis not present

## 2020-06-27 DIAGNOSIS — F32A Depression, unspecified: Secondary | ICD-10-CM | POA: Diagnosis not present

## 2020-06-27 DIAGNOSIS — R7401 Elevation of levels of liver transaminase levels: Secondary | ICD-10-CM | POA: Diagnosis not present

## 2020-06-27 DIAGNOSIS — F419 Anxiety disorder, unspecified: Secondary | ICD-10-CM | POA: Diagnosis not present
